# Patient Record
Sex: Female | Born: 1945
Health system: Southern US, Community
[De-identification: ages and names within clinical notes are randomized; demographics above are authoritative.]

## PROBLEM LIST (undated history)

## (undated) DIAGNOSIS — M1990 Unspecified osteoarthritis, unspecified site: Secondary | ICD-10-CM

## (undated) DIAGNOSIS — M419 Scoliosis, unspecified: Secondary | ICD-10-CM

## (undated) DIAGNOSIS — I10 Essential (primary) hypertension: Secondary | ICD-10-CM

## (undated) DIAGNOSIS — M5136 Other intervertebral disc degeneration, lumbar region: Secondary | ICD-10-CM

## (undated) DIAGNOSIS — M51369 Other intervertebral disc degeneration, lumbar region without mention of lumbar back pain or lower extremity pain: Secondary | ICD-10-CM

## (undated) HISTORY — DX: Essential (primary) hypertension: I10

## (undated) HISTORY — DX: Unspecified osteoarthritis, unspecified site: M19.90

## (undated) HISTORY — DX: Scoliosis, unspecified: M41.9

## (undated) HISTORY — DX: Other intervertebral disc degeneration, lumbar region: M51.36

## (undated) HISTORY — PX: OTHER SURGICAL HISTORY: SHX169

## (undated) HISTORY — PX: TUBAL LIGATION: SHX77

## (undated) HISTORY — DX: Other intervertebral disc degeneration, lumbar region without mention of lumbar back pain or lower extremity pain: M51.369

---

## 2016-02-15 ENCOUNTER — Ambulatory Visit (INDEPENDENT_AMBULATORY_CARE_PROVIDER_SITE_OTHER): Payer: Medicare Other | Admitting: Osteopathic Medicine

## 2016-02-15 VITALS — BP 170/90 | HR 108 | Temp 98.0°F | Resp 17 | Ht 64.0 in | Wt 203.0 lb

## 2016-02-15 DIAGNOSIS — B9789 Other viral agents as the cause of diseases classified elsewhere: Principal | ICD-10-CM

## 2016-02-15 DIAGNOSIS — I10 Essential (primary) hypertension: Secondary | ICD-10-CM | POA: Diagnosis not present

## 2016-02-15 DIAGNOSIS — J069 Acute upper respiratory infection, unspecified: Secondary | ICD-10-CM | POA: Diagnosis not present

## 2016-02-15 MED ORDER — BENZONATATE 200 MG PO CAPS: 200.0000 mg | ORAL_CAPSULE | Freq: Three times a day (TID) | ORAL | Status: DC | PRN

## 2016-02-15 MED ORDER — HYDROCODONE-HOMATROPINE 5-1.5 MG/5ML PO SYRP: 5.0000 mL | ORAL_SOLUTION | Freq: Three times a day (TID) | ORAL | Status: DC | PRN

## 2016-02-15 MED ORDER — IPRATROPIUM BROMIDE 0.03 % NA SOLN: 2.0000 | Freq: Three times a day (TID) | NASAL | Status: DC

## 2016-02-15 MED ORDER — HYDROCHLOROTHIAZIDE 25 MG PO TABS: 12.5000 mg | ORAL_TABLET | Freq: Every day | ORAL | Status: DC

## 2016-02-15 NOTE — Progress Notes (Signed)
HPI: Beth Bryant is a 70 y.o. female who presents to [facility] today for chief complaint of:  Chief Complaint  Patient presents with  . Nasal Congestion  . Cough  . Laryngitis  . Shortness of Breath    ILLNESS . Location: head and R ear . Quality: burning, scratchy, coughing . Duration: 4 days or so . Modifying factors: has tried Tylenol and OTC DayQuil   HYPERTENSION - Patient reports that her blood pressure has been high in the past, she is very resistant to starting medications for this, she says that it is due to being overweight. last blood work 6 - 7 years ago at a different clinic for routine screening for cholesterol diabetes and others.. Patient denies chest pain/pressure, palpitations, shortness of breath.   Past medical, social and family history reviewed: No past medical history on file. No past surgical history on file. Social History  Substance Use Topics  . Smoking status: Never Smoker   . Smokeless tobacco: Not on file  . Alcohol Use: Not on file   No family history on file.  Current Outpatient Prescriptions  Medication Sig Dispense Refill  . Multiple Vitamins-Minerals (CENTRUM ADULTS PO) Take by mouth.    . Omega-3 Fatty Acids (FISH OIL) 1000 MG CAPS Take by mouth.    . polycarbophil (FIBERCON) 625 MG tablet Take 625 mg by mouth daily.     No current facility-administered medications for this visit.   No Known Allergies    Review of Systems: CONSTITUTIONAL:  No  fever, no chills, HEAD/EYES/EARS/NOSE/THROAT: (+) headache, no vision change, no hearing change, (+) sore throat, (+) sinus pressure CARDIAC: No  chest pain, RESPIRATORY: (+) cough, No  shortness of breath/wheeze GASTROINTESTINAL: No nausea, No  vomiting, No  abdominal pain, SKIN: No  rash/wounds/concerning lesions   Exam:  BP 170/90 mmHg  Pulse 108  Temp(Src) 98 F (36.7 C) (Oral)  Resp 17  Ht  (1.626 m)  Wt 203 lb (92.08 kg)  BMI 34.83 kg/m2   SpO2 96% Constitutional: VS see above. General Appearance: alert, well-developed, well-nourished, NAD Eyes: Normal lids and conjunctive, non-icteric sclera,  Ears, Nose, Mouth, Throat: MMM, Normal external inspection ears/nares/mouth/lips/gums, TM normal bilaterally. Pharynx (+) erythema, no exudate. Coughing.  Neck: No masses, trachea midline. No thyroid enlargement/tenderness/mass appreciated. No lymphadenopathy Respiratory: Normal respiratory effort. no wheeze, no rhonchi, no rales Cardiovascular: S1/S2 normal, no murmur, no rub/gallop auscultated. RRR.  Musculoskeletal: Gait normal. No clubbing/cyanosis of digits.  Skin: warm, dry, intact. No rash/ulcer Psychiatric: Normal judgment/insight. Normal mood and affect.     ASSESSMENT/PLAN:   Most likely viral URI given duration and severity of symptoms, treat symptomatically as below . ER/RTC precautions reviewed   Patient initially declined blood pressure medications however as I was working on her discharge noted that she was okay to start something low-dose. I advised we should get CBC and CMP today for medication safety purposes, she will still need follow-up within 2 weeks for recheck of blood pressure and further discussion.  Viral URI with cough - Medications as below, avoid decongestants and NSAIDs.  - Plan: ipratropium (ATROVENT) 0.03 % nasal spray, benzonatate (TESSALON) 200 MG capsule, HYDROcodone-homatropine (HYCODAN) 5-1.5 MG/5ML syrup  Essential hypertension - Patient declines to initiate medication today - Plan: hydrochlorothiazide (HYDRODIURIL) 25 MG tablet, CBC with Differential/Platelet, COMPLETE METABOLIC PANEL WITH GFR      Visit summary printed and instructions reviewed with the patient. All questions answered.  Return in about 2  weeks (around 02/29/2016), or sooner if needed, for BLOOD PRESSURE FOLLOW UP.

## 2016-02-15 NOTE — Addendum Note (Signed)
Addended by: Marcellus ScottADKINS, Hassie Mandt L on: 02/15/2016 05:04 PM   Modules accepted: Orders

## 2016-02-15 NOTE — Patient Instructions (Addendum)
You have been given prescriptions for nasal spray to help with drainage and sore throat/cough, pills for cough to use any time, syrup for cough to use at nighttime/evening. You can also use Tylenol, this will not affect blood pressure. I would avoid NSAID medications such as ibuprofen, Aleve, Motrin. I would also avoid decongestant medications which are sometimes found in DayQuil. You can also use oral antihistamines over-the-counter such as Benadryl, Zyrtec, Claritin, Allegra or the generic equivalents, ask your pharmacist if there are any questions.  Physical exam today was not concerning for pneumonia/bronchitis, lung sounds were clear. If you find that you are breathing worse, particularly feeling short winded, more productive cough, fever or chest pain, please seek medical attention ASAP.  If you're not feeling better 7-10 days after your symptoms have begun, please let us know.   I highly recommend that he follow-up once you're feeling well for recheck of blood pressure, as well as routine screening labs for other problems such as cholesterol or diabetes issues. High blood pressure over time can put people at risk for heart attack, stroke, and other problems and needs to be taken seriously.       IF you received an x-ray today, you will receive an invoice from [facility]. Please contact [facility] at [phone] with questions or concerns regarding your invoice.   IF you received labwork today, you will receive an invoice from [facility]. Please contact [facility] at [phone] with questions or concerns regarding your invoice.   Our billing staff will not be able to assist you with questions regarding bills from these companies.  You will be contacted with the lab results as soon as they are available. The fastest way to get your results is to activate your My Chart account. Instructions are located on the last page of this paperwork. If you  have not heard from us regarding the results in 2 weeks, please contact this office.

## 2016-02-15 NOTE — Progress Notes (Signed)
Patient walked out of clinic prior to receiving blood draw

## 2016-04-18 ENCOUNTER — Ambulatory Visit (INDEPENDENT_AMBULATORY_CARE_PROVIDER_SITE_OTHER): Payer: Medicare Other

## 2016-04-18 ENCOUNTER — Ambulatory Visit (INDEPENDENT_AMBULATORY_CARE_PROVIDER_SITE_OTHER): Payer: Medicare Other | Admitting: Family Medicine

## 2016-04-18 ENCOUNTER — Encounter: Payer: Self-pay | Admitting: Family Medicine

## 2016-04-18 VITALS — BP 189/119 | HR 99 | Temp 97.1°F | Ht 64.0 in | Wt 199.0 lb

## 2016-04-18 DIAGNOSIS — R635 Abnormal weight gain: Secondary | ICD-10-CM

## 2016-04-18 DIAGNOSIS — I10 Essential (primary) hypertension: Secondary | ICD-10-CM | POA: Diagnosis not present

## 2016-04-18 DIAGNOSIS — Z7189 Other specified counseling: Secondary | ICD-10-CM

## 2016-04-18 DIAGNOSIS — M25512 Pain in left shoulder: Secondary | ICD-10-CM

## 2016-04-18 DIAGNOSIS — Z7689 Persons encountering health services in other specified circumstances: Secondary | ICD-10-CM

## 2016-04-18 DIAGNOSIS — M25519 Pain in unspecified shoulder: Secondary | ICD-10-CM | POA: Insufficient documentation

## 2016-04-18 LAB — URINALYSIS, COMPLETE
BILIRUBIN UA: NEGATIVE
Glucose, UA: NEGATIVE
KETONES UA: NEGATIVE
NITRITE UA: NEGATIVE
Protein, UA: NEGATIVE
RBC UA: NEGATIVE
SPEC GRAV UA: 1.02 (ref 1.005–1.030)
Urobilinogen, Ur: 0.2 mg/dL (ref 0.2–1.0)
pH, UA: 6.5 (ref 5.0–7.5)

## 2016-04-18 LAB — MICROSCOPIC EXAMINATION

## 2016-04-18 MED ORDER — LOSARTAN POTASSIUM-HCTZ 50-12.5 MG PO TABS: 1.0000 | ORAL_TABLET | Freq: Every day | ORAL | Status: DC

## 2016-04-18 NOTE — Patient Instructions (Signed)
Continue current medications. Continue good therapeutic lifestyle changes which include good diet and exercise. Fall precautions discussed with patient. If an FOBT was given today- please return it to our front desk. If you are over 68 years old - you may need Prevnar 13 or the adult Pneumonia vaccine.   After your visit with us today you will receive a survey in the mail or online from [employer] regarding your care with us. Please take a moment to fill this out. Your feedback is very important to us as you can help us better understand your patient needs as well as improve your experience and satisfaction. WE CARE ABOUT YOU!!!    

## 2016-04-18 NOTE — Progress Notes (Signed)
Subjective:    Patient ID: Beth Bryant, female    DOB: August 01, 1946, 70 y.o.   MRN: [id number]  HPI  Pt here for follow up and management of chronic medical problems. She is new with [place] today and she is accompanied today by her husband.  Patient had been seen here previously but is been some years. Her concerns today are her weight which continues to rise and pain in her left shoulder and arm. Regarding the latter, she has seen a chiropractor who has been working on her neck. There is been no injury to the shoulder.  She had been seen recently at [facility] urgent care and given hydrochlorothiazide for blood pressure but failed to get the prescription. She is against taking medicine because she had a son who died from 2 but should anticonvulsant she occasionally takes Tylenol Extra Strength for her arthritis but even is opposed to that.     Patient Active Problem List   Diagnosis Date Noted  . Viral URI with cough 02/15/2016  . Essential hypertension 02/15/2016   Outpatient Encounter Prescriptions as of 04/18/2016  Medication Sig  . [DISCONTINUED] benzonatate (TESSALON) 200 MG capsule Take 1 capsule (200 mg total) by mouth 3 (three) times daily as needed for cough.  . [DISCONTINUED] hydrochlorothiazide (HYDRODIURIL) 25 MG tablet Take 0.5 tablets (12.5 mg total) by mouth daily.  . [DISCONTINUED] HYDROcodone-homatropine (HYCODAN) 5-1.5 MG/5ML syrup Take 5 mLs by mouth every 8 (eight) hours as needed for cough (Caution, can cause sedation, use in evening/nighttime).  . [DISCONTINUED] ipratropium (ATROVENT) 0.03 % nasal spray Place 2 sprays into both nostrils 3 (three) times daily.  . [DISCONTINUED] Multiple Vitamins-Minerals (CENTRUM ADULTS PO) Take by mouth.  . [DISCONTINUED] Omega-3 Fatty Acids (FISH OIL) 1000 MG CAPS Take by mouth.  . [DISCONTINUED] polycarbophil (FIBERCON) 625 MG tablet Take 625 mg by mouth daily.   No facility-administered encounter medications on file as of 04/18/2016.       Review of Systems  Constitutional: Positive for unexpected weight change.  HENT: Negative.   Eyes: Negative.   Respiratory: Negative.   Cardiovascular: Negative.        Elevated BP  Gastrointestinal: Negative.   Endocrine: Negative.   Genitourinary: Negative.   Musculoskeletal: Positive for arthralgias (left shoudler and arm pain).  Skin: Negative.   Allergic/Immunologic: Negative.   Neurological: Negative.   Hematological: Negative.   Psychiatric/Behavioral: Negative.        Objective:   Physical Exam  Constitutional: She appears well-developed and well-nourished.  Cardiovascular: Normal rate and regular rhythm.   Pulmonary/Chest: Effort normal.  Musculoskeletal:  Left shoulder has good range of motion. There is tenderness both anteriorly over the bicipital tendon and laterally over the deltoid tendon and subacromial bursa. X-ray shows some calcification in either the lateral tendon or bursa as well as some spurring and arthritic changes around the joint    BP 189/119 mmHg  Pulse 99  Temp(Src) 97.1 F (36.2 C) (Oral)  Ht _0  (1.626 m)  Wt 199 lb (90.266 kg)  BMI 34.14 kg/m2       Assessment & Plan:  1. Establishing care with new doctor, encounter for Patient is interested in lab work but against taking anything so hopefully we will not find anything that needs treatment - CMP14+EGFR - Lipid panel - Urinalysis, Complete  2. Left shoulder pain Suspect this pain is multifactorial. I think she has some tendinitis or bursitis. She also has disc in her neck which is likely a contributing  factor. She declined injection - DG Shoulder Left; Future  3. Increased body weight Probably related to age and relative inactivity but will check thyroid - TSH - CMP14+EGFR - Lipid panel    5. Essential hypertension Patient is reluctant to take medicine but I definitely feel it's indicated with pressure 189/119. We will begin losartan and hydrochlorothiazide, 50/12.5.  Plan to recheck in one month  Wardell Honour MD

## 2016-04-19 LAB — CMP14+EGFR
ALBUMIN: 4.5 g/dL (ref 3.6–4.8)
ALK PHOS: 76 IU/L (ref 39–117)
ALT: 76 IU/L — ABNORMAL HIGH (ref 0–32)
AST: 76 IU/L — AB (ref 0–40)
Albumin/Globulin Ratio: 1.2 (ref 1.2–2.2)
BILIRUBIN TOTAL: 0.8 mg/dL (ref 0.0–1.2)
BUN / CREAT RATIO: 15 (ref 12–28)
BUN: 10 mg/dL (ref 8–27)
CHLORIDE: 99 mmol/L (ref 96–106)
CO2: 24 mmol/L (ref 18–29)
Calcium: 9.6 mg/dL (ref 8.7–10.3)
Creatinine, Ser: 0.65 mg/dL (ref 0.57–1.00)
GFR calc Af Amer: 105 mL/min/{1.73_m2} (ref >59)
GFR calc non Af Amer: 92 mL/min/{1.73_m2} (ref >59)
GLOBULIN, TOTAL: 3.7 g/dL (ref 1.5–4.5)
GLUCOSE: 92 mg/dL (ref 65–99)
Potassium: 4.6 mmol/L (ref 3.5–5.2)
SODIUM: 140 mmol/L (ref 134–144)
Total Protein: 8.2 g/dL (ref 6.0–8.5)

## 2016-04-19 LAB — TSH: TSH: 1.45 u[IU]/mL (ref 0.450–4.500)

## 2016-04-19 LAB — LIPID PANEL
CHOLESTEROL TOTAL: 219 mg/dL — AB (ref 100–199)
Chol/HDL Ratio: 2.9 ratio units (ref 0.0–4.4)
HDL: 75 mg/dL (ref >39)
LDL Calculated: 119 mg/dL — ABNORMAL HIGH (ref 0–99)
TRIGLYCERIDES: 123 mg/dL (ref 0–149)
VLDL Cholesterol Cal: 25 mg/dL (ref 5–40)

## 2016-05-21 ENCOUNTER — Ambulatory Visit (INDEPENDENT_AMBULATORY_CARE_PROVIDER_SITE_OTHER): Payer: Medicare Other | Admitting: Family Medicine

## 2016-05-21 ENCOUNTER — Encounter: Payer: Self-pay | Admitting: Family Medicine

## 2016-05-21 VITALS — BP 141/92 | HR 89 | Temp 97.0°F | Ht 64.0 in | Wt 201.4 lb

## 2016-05-21 DIAGNOSIS — I10 Essential (primary) hypertension: Secondary | ICD-10-CM | POA: Diagnosis not present

## 2016-05-21 MED ORDER — GABAPENTIN 300 MG PO CAPS: ORAL_CAPSULE | ORAL | Status: DC

## 2016-05-21 NOTE — Progress Notes (Signed)
   Subjective:    Patient ID: Mariella SaaCharlotte Kil, female    DOB: 1946/07/19, 70 y.o.   MRN: [id number]  HPI Patient is here today for a 1 month follow up on her hypertension. Patient is tolerating losartan/hctz 50/25.  Patient seems pleased that her blood pressure is normalized. She feels better in general without the headaches. Pressures at home have again been better than we recorded here this morning. I had some doubts that she would take the medicine but today she is asking for something for back and neck. Apparently she has some disc disease per the chiropractor. She has some paresthesias in both arms and legs as well as pain in neck and back. She asked about MRI but I told her we probably needed some conservative therapy before MRI will be paid for.   Review of Systems  Constitutional: Negative.   HENT: Negative.   Eyes: Negative.   Respiratory: Negative.   Cardiovascular: Negative.   Gastrointestinal: Negative.   Endocrine: Negative.   Genitourinary: Negative.   Musculoskeletal: Negative.   Skin: Negative.   Allergic/Immunologic: Negative.   Neurological: Negative.   Hematological: Negative.   Psychiatric/Behavioral: Negative.     [facility] 05/21/2016 04/18/2016 02/15/2016  Decreased Interest 0 0 0  Down, Depressed, Hopeless 1 1 0  PHQ - 2 Score 1 1 0       Patient Active Problem List   Diagnosis Date Noted  . Pain in joint, shoulder region 04/18/2016  . Viral URI with cough 02/15/2016  . Essential hypertension 02/15/2016   Outpatient Encounter Prescriptions as of 05/21/2016  Medication Sig  . losartan-hydrochlorothiazide (HYZAAR) 50-12.5 MG tablet Take 1 tablet by mouth daily.   No facility-administered encounter medications on file as of 05/21/2016.       Objective:   Physical Exam  Constitutional: She appears well-developed and well-nourished.  Cardiovascular: Normal rate and regular rhythm.   Pulmonary/Chest: Effort normal and breath sounds  normal.  Musculoskeletal:  Reflexes are symmetric in the lower and upper extremities and strength is appropriate 5+ arms and legs   BP 141/92 mmHg  Pulse 89  Temp(Src) 97 F (36.1 C) (Oral)  Ht 5\' 4"  (1.626 m)  Wt 201 lb 6.4 oz (91.354 kg)  BMI 34.55 kg/m2        Assessment & Plan:  1. Essential hypertension Spots to losartan hydrochlorothiazide. Continue same.  Frederica KusterStephen M Miller MD

## 2016-05-22 ENCOUNTER — Telehealth: Payer: Self-pay | Admitting: Family Medicine

## 2016-05-23 NOTE — Telephone Encounter (Signed)
Spoke with pt and advised Gabapentin can be used commonly for multiple different disorders. Pt voiced understanding and will try taking it tonight.

## 2016-06-09 ENCOUNTER — Encounter: Payer: Self-pay | Admitting: Family Medicine

## 2016-10-05 ENCOUNTER — Other Ambulatory Visit: Payer: Self-pay | Admitting: Family Medicine

## 2016-10-06 ENCOUNTER — Other Ambulatory Visit: Payer: Self-pay | Admitting: Family Medicine

## 2016-10-06 MED ORDER — LOSARTAN POTASSIUM-HCTZ 50-12.5 MG PO TABS: 1.0000 | ORAL_TABLET | Freq: Every day | ORAL | 0 refills | Status: DC

## 2016-10-06 NOTE — Telephone Encounter (Signed)
appt made for 12/8 & refill for #30 sent to [facility]

## 2016-10-16 NOTE — Progress Notes (Signed)
   Subjective:    Patient ID: Beth Bryant, female    DOB: 1946-04-23, 70 y.o.   MRN: [id number]  HPI 70 year old female here to follow-up hypertension she generally feels better once her blood pressure has been controlled. The morning headaches have disappeared. She is starting to move about more even walking.  Patient Active Problem List   Diagnosis Date Noted  . Pain in joint, shoulder region 04/18/2016  . Viral URI with cough 02/15/2016  . Essential hypertension 02/15/2016   Outpatient Encounter Prescriptions as of 10/17/2016  Medication Sig  . gabapentin (NEURONTIN) 300 MG capsule Take 1 tablet at bedtime for 3 days and add 1 tablet in a.m.  Marland Kitchen. losartan-hydrochlorothiazide (HYZAAR) 50-12.5 MG tablet Take 1 tablet by mouth daily.   No facility-administered encounter medications on file as of 10/17/2016.       Review of Systems  Constitutional: Negative.   Respiratory: Negative.   Cardiovascular: Negative.   Gastrointestinal: Negative.   Genitourinary: Negative.   Neurological: Negative.   Psychiatric/Behavioral: Negative.        Objective:   Physical Exam  Constitutional: She is oriented to person, place, and time. She appears well-developed and well-nourished.  HENT:  Head: Normocephalic.  Eyes: Pupils are equal, round, and reactive to light.  Cardiovascular: Normal rate and regular rhythm.   Pulmonary/Chest: Effort normal and breath sounds normal.  Abdominal: Soft.  Neurological: She is alert and oriented to person, place, and time.  Psychiatric: She has a normal mood and affect. Her behavior is normal.   BP 137/84 (BP Location: Left Arm, Patient Position: Sitting, Cuff Size: Normal)   Pulse 83   Temp 97 F (36.1 C) (Oral)   Ht 5\' 4"  (1.626 m)   Wt 206 lb (93.4 kg)   BMI 35.36 kg/m         Assessment & Plan:  1. Essential hypertension Blood pressure is well controlled. We talked about taking medicine at night. Even though it has diuretic it does not  make her urinate more frequently. I think we will get better morning and early day coverage taking medicine at night. Return in 6 months  Frederica KusterStephen M Miller MD

## 2016-10-17 ENCOUNTER — Ambulatory Visit (INDEPENDENT_AMBULATORY_CARE_PROVIDER_SITE_OTHER): Payer: Medicare Other | Admitting: Family Medicine

## 2016-10-17 ENCOUNTER — Encounter: Payer: Self-pay | Admitting: Family Medicine

## 2016-10-17 VITALS — BP 137/84 | HR 83 | Temp 97.0°F | Ht 64.0 in | Wt 206.0 lb

## 2016-10-17 DIAGNOSIS — I10 Essential (primary) hypertension: Secondary | ICD-10-CM

## 2016-10-17 MED ORDER — LOSARTAN POTASSIUM-HCTZ 50-12.5 MG PO TABS: 1.0000 | ORAL_TABLET | Freq: Every day | ORAL | 1 refills | Status: DC

## 2016-11-24 ENCOUNTER — Other Ambulatory Visit: Payer: Self-pay | Admitting: Family Medicine

## 2016-11-25 ENCOUNTER — Other Ambulatory Visit: Payer: Self-pay | Admitting: Family Medicine

## 2016-11-25 MED ORDER — GABAPENTIN 300 MG PO CAPS: ORAL_CAPSULE | ORAL | 3 refills | Status: DC

## 2016-11-25 NOTE — Telephone Encounter (Signed)
Med lled

## 2017-01-26 ENCOUNTER — Encounter: Payer: Self-pay | Admitting: Family Medicine

## 2017-01-26 ENCOUNTER — Ambulatory Visit (INDEPENDENT_AMBULATORY_CARE_PROVIDER_SITE_OTHER): Payer: Medicare Other | Admitting: Family Medicine

## 2017-01-26 VITALS — BP 134/81 | HR 92 | Temp 97.1°F | Ht 64.0 in | Wt 204.0 lb

## 2017-01-26 DIAGNOSIS — I10 Essential (primary) hypertension: Secondary | ICD-10-CM

## 2017-01-26 MED ORDER — LOSARTAN POTASSIUM-HCTZ 50-12.5 MG PO TABS: 1.0000 | ORAL_TABLET | Freq: Every day | ORAL | 1 refills | Status: DC

## 2017-01-26 NOTE — Progress Notes (Signed)
   Subjective:    Patient ID: Beth Bryant, female    DOB: 06-22-1946, 71 y.o.   MRN: [id number]  HPI Patient here today for follow up on HTN.  Patient monitors pressure at home but she brought in her cuff and when compared to cuff here pressures were much higher with hers. I think it probably is not accurate. She takes losartan and hydrochlorothiazide and tolerates this well. She had also previously been on gabapentin for some joint symptoms but still did not feel it was effective and discontinued.    Patient Active Problem List   Diagnosis Date Noted  . Pain in joint, shoulder region 04/18/2016  . Viral URI with cough 02/15/2016  . Essential hypertension 02/15/2016   Outpatient Encounter Prescriptions as of 01/26/2017  Medication Sig  . losartan-hydrochlorothiazide (HYZAAR) 50-12.5 MG tablet Take 1 tablet by mouth daily.  Marland Kitchen. gabapentin (NEURONTIN) 300 MG capsule Take 1 tablet at bedtime for 3 days and add 1 tablet in a.m. (Patient not taking: Reported on 01/26/2017)   No facility-administered encounter medications on file as of 01/26/2017.      Review of Systems  Constitutional: Negative.   HENT: Negative.   Eyes: Negative.   Respiratory: Negative.   Cardiovascular: Negative.   Gastrointestinal: Negative.   Endocrine: Negative.   Genitourinary: Negative.   Musculoskeletal: Negative.   Skin: Negative.   Allergic/Immunologic: Negative.   Neurological: Negative.   Hematological: Negative.   Psychiatric/Behavioral: Negative.        Objective:   Physical Exam  Constitutional: She is oriented to person, place, and time. She appears well-developed and well-nourished.  Cardiovascular: Normal rate, regular rhythm and normal heart sounds.   Pulmonary/Chest: Effort normal and breath sounds normal.  Musculoskeletal: She exhibits no edema.  Neurological: She is alert and oriented to person, place, and time.  Psychiatric: She has a normal mood and affect. Her behavior is normal.     BP 134/81 (BP Location: Left Arm)   Pulse 92   Temp 97.1 F (36.2 C) (Oral)   Ht 5\' 4"  (1.626 m)   Wt 204 lb (92.5 kg)   BMI 35.02 kg/m         Assessment & Plan:  1. Essential hypertension Blood pressure is well controlled on losartan and hydrochlorothiazide  Frederica KusterStephen M Miller MD

## 2017-06-01 ENCOUNTER — Ambulatory Visit (INDEPENDENT_AMBULATORY_CARE_PROVIDER_SITE_OTHER): Payer: Medicare Other | Admitting: Family Medicine

## 2017-06-01 ENCOUNTER — Encounter: Payer: Self-pay | Admitting: Family Medicine

## 2017-06-01 ENCOUNTER — Ambulatory Visit (INDEPENDENT_AMBULATORY_CARE_PROVIDER_SITE_OTHER): Payer: Medicare Other

## 2017-06-01 VITALS — BP 131/89 | HR 120 | Temp 97.0°F | Ht 64.0 in | Wt 191.8 lb

## 2017-06-01 DIAGNOSIS — R0982 Postnasal drip: Secondary | ICD-10-CM

## 2017-06-01 DIAGNOSIS — R059 Cough, unspecified: Secondary | ICD-10-CM

## 2017-06-01 DIAGNOSIS — R05 Cough: Secondary | ICD-10-CM

## 2017-06-01 NOTE — Patient Instructions (Signed)
Great to meet you1  Start using the neti pot daily - start taking plain zyrtec 1 pill once daily, give it a week or so and I think you will see some improvements

## 2017-06-01 NOTE — Progress Notes (Signed)
   HPI  Patient presents today here with congestion and cough.  Patient's lines over the last 6-8 months she's had persistent cough with worsening congestion.  Patient states that she has recurrent throat clearing early morning, she wakes up at night with productive cough, she does have some mild intermittent dyspnea during these episodes. She denies any fever, chills, sweats.  She does not like a dry nose and so has not tried many medications, also because of concerns about mixing with hypertensive medications.   PMH: Smoking status noted ROS: Per HPI  Objective: BP 131/89   Pulse (!) 120   Temp (!) 97 F (36.1 C) (Oral)   Ht 5\' 4"  (1.626 m)   Wt 191 lb 12.8 oz (87 kg)   SpO2 94%   BMI 32.92 kg/m  Gen: NAD, alert, cooperative with exam HEENT: NCAT, nares with swollen turbinates bilaterally and erythematous mucosa, oropharynx clear, TMs normal bilaterally CV: RRR, good S1/S2, no murmur Resp: Soft crackles in bilateral bases, otherwise clear Abd: SNTND, BS present, no guarding or organomegaly Ext: No edema, warm Neuro: Alert and oriented, No gross deficits  Assessment and plan:  # Postnasal drip, cough Patient with possibly multifactorial cough, certainly she has some postnasal drip and shooting. Start using nasla saline rinses daily,  Start antihistamine DG CXR with soft crackles- no signs of tryue volume overload.     Orders Placed This Encounter  Procedures  . DG Chest 2 View    Standing Status:   Future    Standing Expiration Date:   08/02/2018    Order Specific Question:   Reason for Exam (SYMPTOM  OR DIAGNOSIS REQUIRED)    Answer:   cough, r/o CAP or effusion    Order Specific Question:   Preferred imaging location?    Answer:   Internal    Order Specific Question:   Radiology Contrast Protocol - do NOT remove file path    Answer:   [email or web address]    Murtis SinkSam Jaidyn Kuhl, MD [facility] 06/01/2017, 11:55 AM

## 2017-07-29 ENCOUNTER — Ambulatory Visit (INDEPENDENT_AMBULATORY_CARE_PROVIDER_SITE_OTHER): Payer: Medicare Other | Admitting: Family Medicine

## 2017-07-29 ENCOUNTER — Encounter: Payer: Self-pay | Admitting: Family Medicine

## 2017-07-29 VITALS — BP 115/76 | HR 106 | Temp 97.9°F | Ht 64.0 in | Wt 186.5 lb

## 2017-07-29 DIAGNOSIS — Z6830 Body mass index (BMI) 30.0-30.9, adult: Secondary | ICD-10-CM | POA: Diagnosis not present

## 2017-07-29 DIAGNOSIS — R0981 Nasal congestion: Secondary | ICD-10-CM | POA: Diagnosis not present

## 2017-07-29 DIAGNOSIS — Z780 Asymptomatic menopausal state: Secondary | ICD-10-CM

## 2017-07-29 DIAGNOSIS — Z1159 Encounter for screening for other viral diseases: Secondary | ICD-10-CM | POA: Diagnosis not present

## 2017-07-29 DIAGNOSIS — E669 Obesity, unspecified: Secondary | ICD-10-CM | POA: Diagnosis not present

## 2017-07-29 DIAGNOSIS — I10 Essential (primary) hypertension: Secondary | ICD-10-CM | POA: Diagnosis not present

## 2017-07-29 MED ORDER — LOSARTAN POTASSIUM-HCTZ 50-12.5 MG PO TABS: 1.0000 | ORAL_TABLET | Freq: Every day | ORAL | 3 refills | Status: DC

## 2017-07-29 MED ORDER — FLUTICASONE PROPIONATE 50 MCG/ACT NA SUSP: 1.0000 | Freq: Two times a day (BID) | NASAL | 6 refills | Status: DC | PRN

## 2017-07-29 NOTE — Progress Notes (Signed)
BP (!) 145/96   Pulse (!) 106   Temp 97.9 F (36.6 C) (Oral)   Ht '5\' 4"'$  (1.626 m)   Wt 186 lb 8 oz (84.6 kg)   BMI 32.01 kg/m    Subjective:    Patient ID: Beth Bryant, female    DOB: Jul 18, 1946, 71 y.o.   MRN: [id number]  HPI: Beth Bryant is a 71 y.o. female presenting on 07/29/2017 for Hypertension (6 mo) and Cough (sinus drainage; using Mucinex Severe Cold)   HPI Hypertension Patient is currently on Lisinopril-hydrochlorothiazide and is 145/96 initially and then improved on recheck. She says that her blood pressures run pretty well at home*. Patient denies any lightheadedness or dizziness. Patient denies headaches, blurred vision, chest pains, shortness of breath, or weakness. Denies any side effects from medication and is content with current medication.   Patient has been having sinus congestion that's been going on off and on over the past couple months. She says it will get worse and then better than worse in the better. She's been using Mucinex for it which does help significantly but cannot eradicate it.  Relevant past medical, surgical, family and social history reviewed and updated as indicated. Interim medical history since our last visit reviewed. Allergies and medications reviewed and updated.  Review of Systems  Constitutional: Negative for chills and fever.  HENT: Positive for congestion, postnasal drip, rhinorrhea, sinus pressure, sneezing and sore throat. Negative for ear discharge and ear pain.   Eyes: Negative for pain, redness and visual disturbance.  Respiratory: Positive for cough. Negative for chest tightness and shortness of breath.   Cardiovascular: Negative for chest pain and leg swelling.  Genitourinary: Negative for difficulty urinating and dysuria.  Musculoskeletal: Negative for back pain and gait problem.  Skin: Negative for rash.  Neurological: Negative for dizziness, light-headedness and headaches.  Psychiatric/Behavioral: Negative  for agitation and behavioral problems.  All other systems reviewed and are negative.   Per HPI unless specifically indicated above     Objective:    BP (!) 145/96   Pulse (!) 106   Temp 97.9 F (36.6 C) (Oral)   Ht '5\' 4"'$  (1.626 m)   Wt 186 lb 8 oz (84.6 kg)   BMI 32.01 kg/m   Wt Readings from Last 3 Encounters:  07/29/17 186 lb 8 oz (84.6 kg)  06/01/17 191 lb 12.8 oz (87 kg)  01/26/17 204 lb (92.5 kg)    Physical Exam  Constitutional: She is oriented to person, place, and time. She appears well-developed and well-nourished. No distress.  HENT:  Right Ear: Tympanic membrane, external ear and ear canal normal.  Left Ear: Tympanic membrane, external ear and ear canal normal.  Nose: Mucosal edema and rhinorrhea present. No epistaxis. Right sinus exhibits no maxillary sinus tenderness and no frontal sinus tenderness. Left sinus exhibits no maxillary sinus tenderness and no frontal sinus tenderness.  Mouth/Throat: Uvula is midline and mucous membranes are normal. Posterior oropharyngeal edema present. No oropharyngeal exudate, posterior oropharyngeal erythema or tonsillar abscesses.  Eyes: Conjunctivae and EOM are normal.  Cardiovascular: Normal rate, regular rhythm, normal heart sounds and intact distal pulses.   No murmur heard. Pulmonary/Chest: Effort normal and breath sounds normal. No respiratory distress. She has no wheezes.  Musculoskeletal: Normal range of motion. She exhibits no edema or tenderness.  Neurological: She is alert and oriented to person, place, and time. Coordination normal.  Skin: Skin is warm and dry. No rash noted. She is not diaphoretic.  Psychiatric: She has  a normal mood and affect. Her behavior is normal.  Vitals reviewed.       Assessment & Plan:   Problem List Items Addressed This Visit      Cardiovascular and Mediastinum   Essential hypertension - Primary   Relevant Medications   losartan-hydrochlorothiazide (HYZAAR) 50-12.5 MG tablet    Other Relevant Orders   CMP14+EGFR    Other Visit Diagnoses    Class 1 obesity without serious comorbidity with body mass index (BMI) of 30.0 to 30.9 in adult, unspecified obesity type       Relevant Orders   Lipid panel   Need for hepatitis C screening test       Relevant Orders   Hepatitis C antibody   Postmenopausal       Relevant Orders   DG Bone Density   Sinus congestion       Relevant Medications   fluticasone (FLONASE) 50 MCG/ACT nasal spray       Follow up plan: Return in about 6 months (around 01/26/2018), or if symptoms worsen or fail to improve, for Hypertension follow-up and labs.  Counseling provided for all of the vaccine components No orders of the defined types were placed in this encounter.   Caryl Pina, MD [facility] 07/29/2017, 1:28 PM

## 2017-07-30 LAB — CMP14+EGFR
A/G RATIO: 1.2 (ref 1.2–2.2)
ALK PHOS: 73 IU/L (ref 39–117)
ALT: 49 IU/L — ABNORMAL HIGH (ref 0–32)
AST: 57 IU/L — AB (ref 0–40)
Albumin: 4.6 g/dL (ref 3.6–4.8)
BILIRUBIN TOTAL: 0.5 mg/dL (ref 0.0–1.2)
BUN/Creatinine Ratio: 12 (ref 12–28)
BUN: 9 mg/dL (ref 8–27)
CO2: 28 mmol/L (ref 20–29)
Calcium: 10.2 mg/dL (ref 8.7–10.3)
Chloride: 95 mmol/L — ABNORMAL LOW (ref 96–106)
Creatinine, Ser: 0.76 mg/dL (ref 0.57–1.00)
GFR calc Af Amer: 93 mL/min/{1.73_m2} (ref >59)
GFR, EST NON AFRICAN AMERICAN: 80 mL/min/{1.73_m2} (ref >59)
GLOBULIN, TOTAL: 3.8 g/dL (ref 1.5–4.5)
Glucose: 82 mg/dL (ref 65–99)
POTASSIUM: 4.6 mmol/L (ref 3.5–5.2)
SODIUM: 136 mmol/L (ref 134–144)
Total Protein: 8.4 g/dL (ref 6.0–8.5)

## 2017-07-30 LAB — LIPID PANEL
CHOLESTEROL TOTAL: 194 mg/dL (ref 100–199)
Chol/HDL Ratio: 2.9 ratio (ref 0.0–4.4)
HDL: 66 mg/dL (ref >39)
LDL Calculated: 103 mg/dL — ABNORMAL HIGH (ref 0–99)
TRIGLYCERIDES: 125 mg/dL (ref 0–149)
VLDL Cholesterol Cal: 25 mg/dL (ref 5–40)

## 2017-07-30 LAB — HEPATITIS C ANTIBODY: Hep C Virus Ab: <0.1 s/co ratio (ref 0.0–0.9)

## 2017-08-06 ENCOUNTER — Other Ambulatory Visit: Payer: Medicare Other

## 2018-01-28 ENCOUNTER — Telehealth: Payer: Self-pay | Admitting: Family Medicine

## 2018-01-28 DIAGNOSIS — R0981 Nasal congestion: Secondary | ICD-10-CM

## 2018-01-28 DIAGNOSIS — I10 Essential (primary) hypertension: Secondary | ICD-10-CM

## 2018-01-28 MED ORDER — FLUTICASONE PROPIONATE 50 MCG/ACT NA SUSP: 1.0000 | Freq: Two times a day (BID) | NASAL | 2 refills | Status: DC | PRN

## 2018-01-28 MED ORDER — LOSARTAN POTASSIUM-HCTZ 50-12.5 MG PO TABS: 1.0000 | ORAL_TABLET | Freq: Every day | ORAL | 0 refills | Status: DC

## 2018-01-28 NOTE — Telephone Encounter (Signed)
Pt aware refill sent into pharmacy 

## 2018-02-20 IMAGING — DX DG SHOULDER 2+V*L*
3 series · 3 of 3 positions shown · non-contrast
Comparison: None.

CLINICAL DATA: 70-year-old with chronic left shoulder pain. No
known injuries.

EXAM:
LEFT SHOULDER - 2+ VIEW

[shoulder ap]
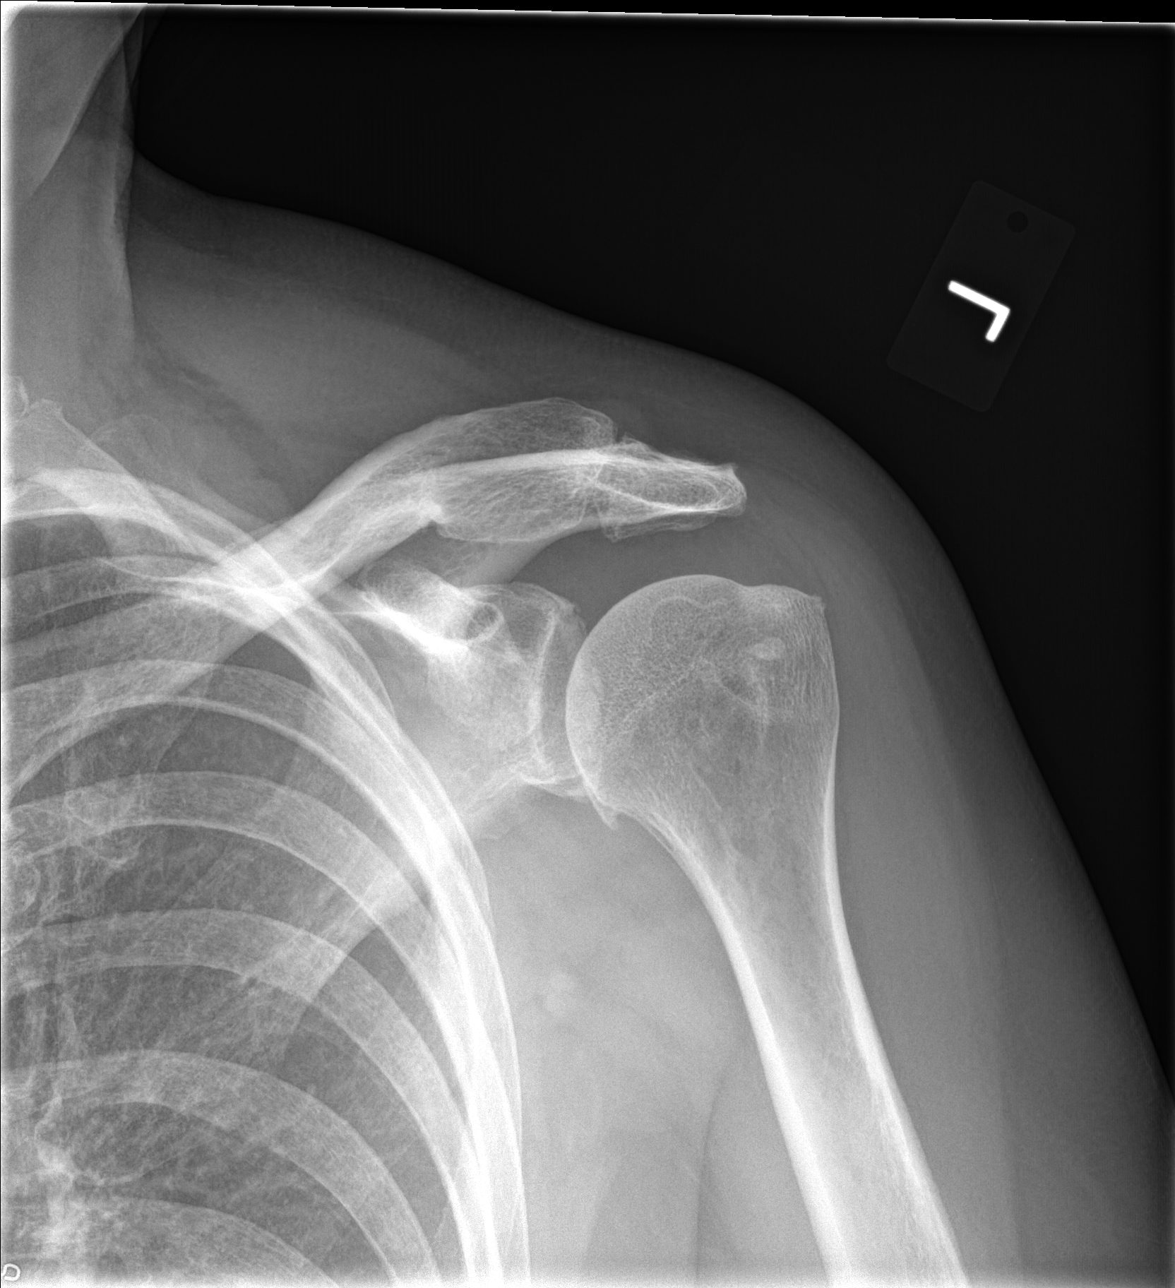

[shoulder obl]
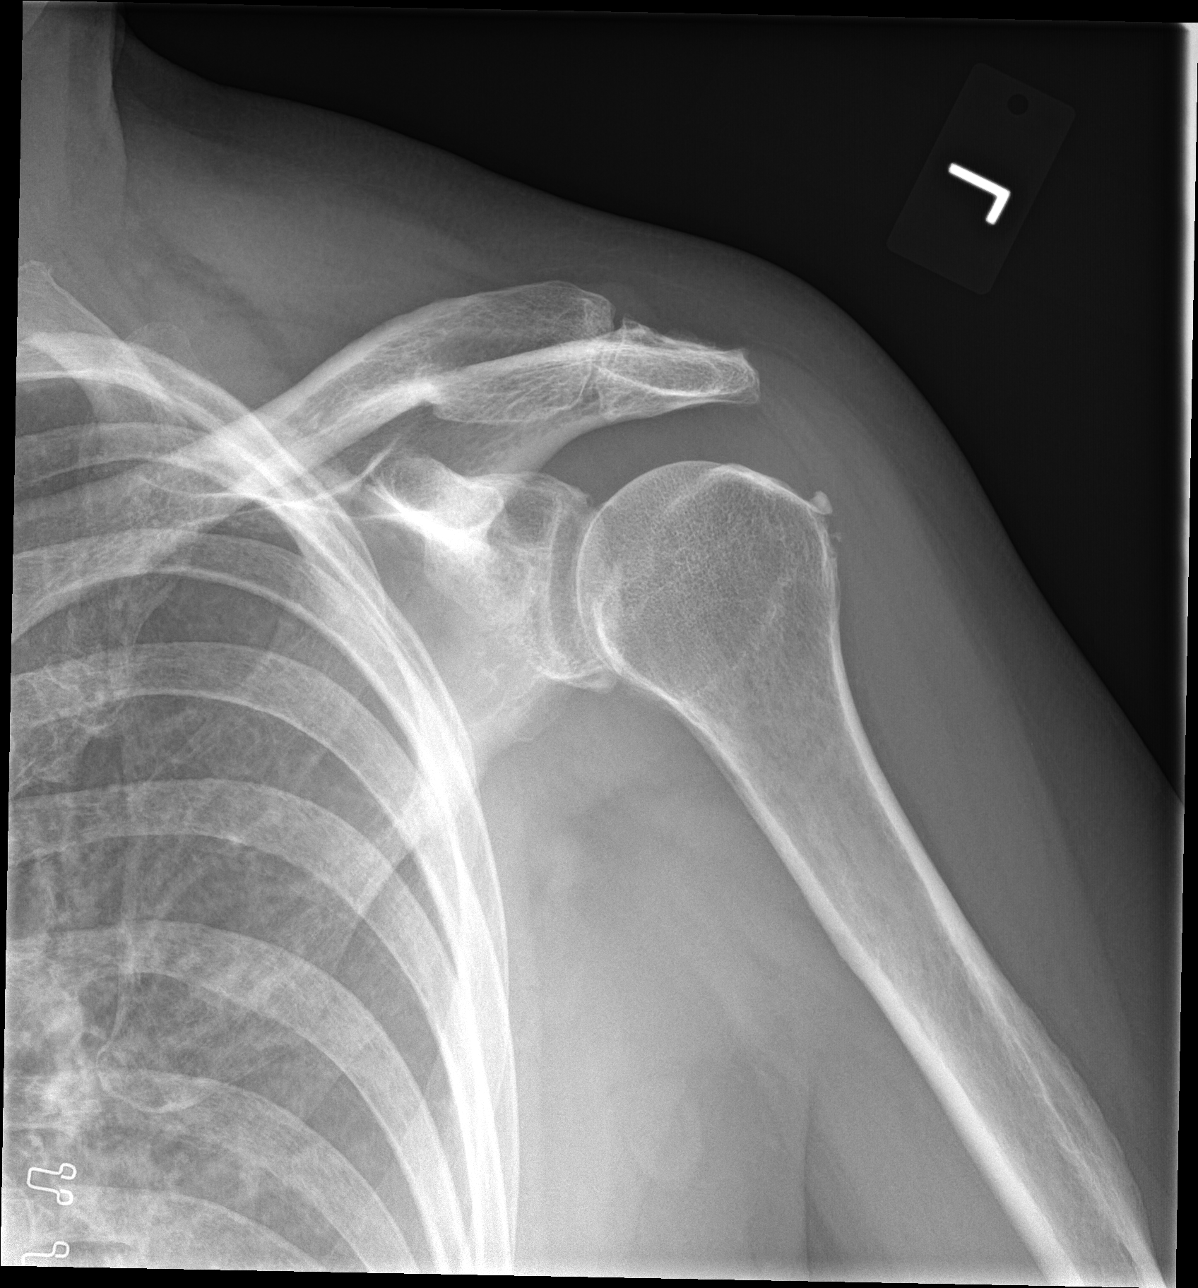

[shoulder swimmer]
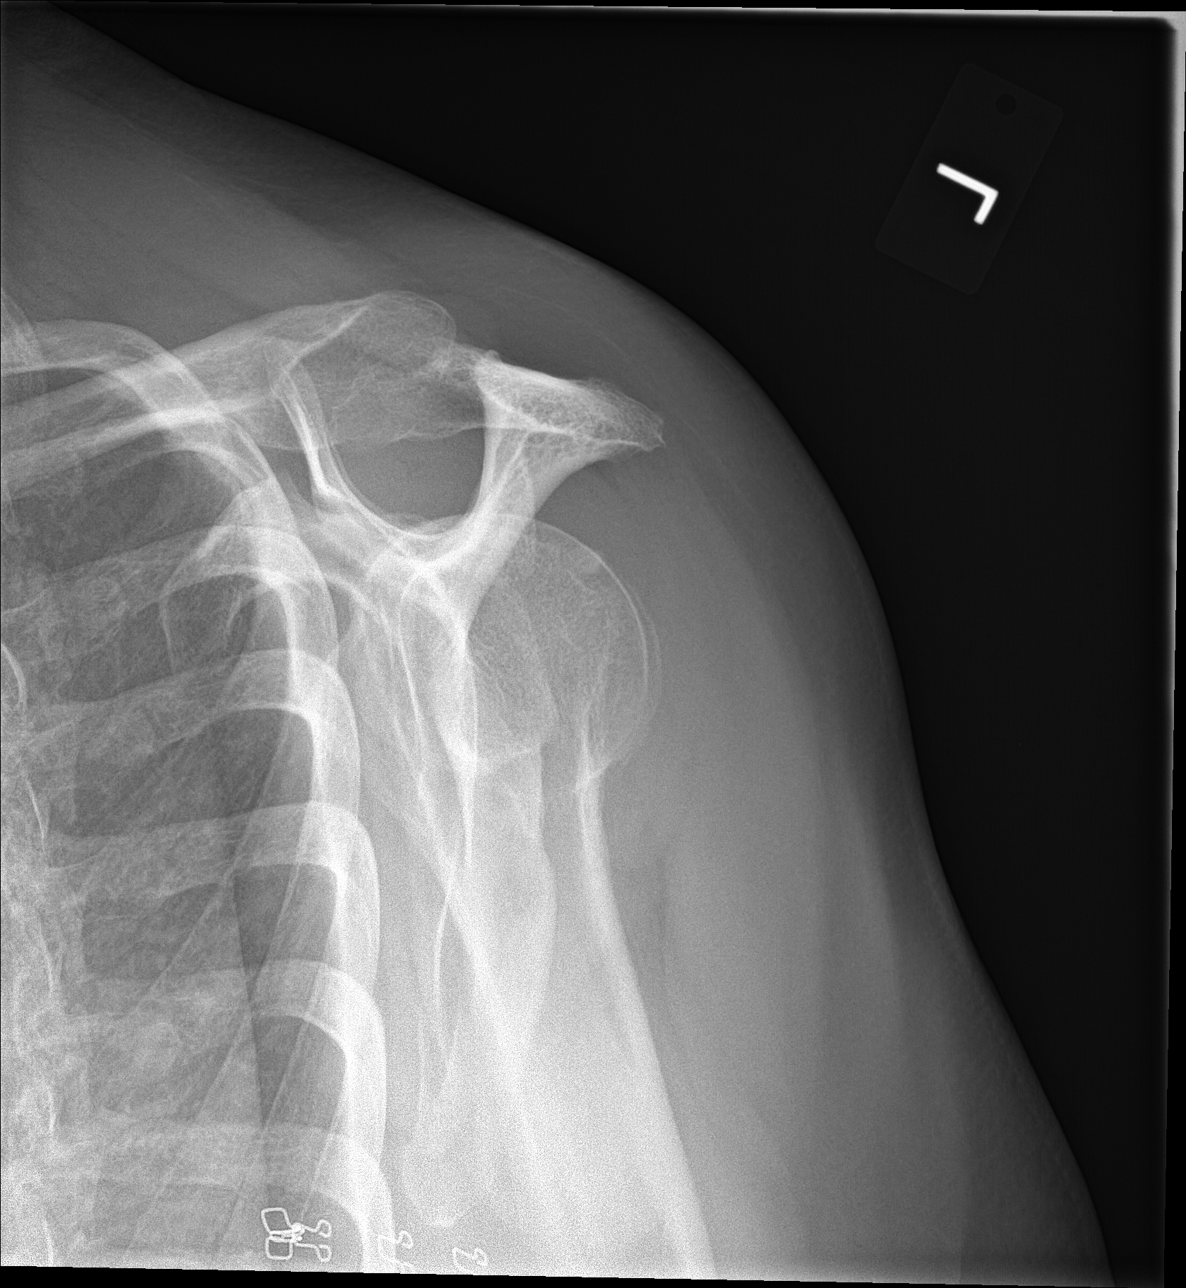

[3 of 3 positions shown; findings below may reference images not displayed]

FINDINGS: No evidence of acute, subacute or healed fractures. Glenohumeral
joint intact with mild to moderate degenerative changes including a
spur arising from the humeral head. Calcification at the insertion
of the supraspinatus tendon on the greater tuberosity.
Acromioclavicular joint intact with mild to moderate degenerative
changes.
IMPRESSION: 1. Calcific supraspinatus tendinitis.
2. Mild to moderate osteoarthritis involving the glenohumeral joint.
3. Mild to moderate degenerative changes involving the AC joint.

## 2018-02-24 ENCOUNTER — Encounter: Payer: Self-pay | Admitting: Family Medicine

## 2018-02-24 ENCOUNTER — Ambulatory Visit: Payer: Medicare Other | Admitting: Family Medicine

## 2018-02-24 VITALS — BP 148/93 | HR 88 | Temp 97.6°F | Ht 64.0 in | Wt 185.0 lb

## 2018-02-24 DIAGNOSIS — Z13220 Encounter for screening for lipoid disorders: Secondary | ICD-10-CM

## 2018-02-24 DIAGNOSIS — I10 Essential (primary) hypertension: Secondary | ICD-10-CM

## 2018-02-24 MED ORDER — LOSARTAN POTASSIUM-HCTZ 50-12.5 MG PO TABS: 1.0000 | ORAL_TABLET | Freq: Every day | ORAL | 1 refills | Status: DC

## 2018-02-24 NOTE — Progress Notes (Signed)
BP (!) 148/93   Pulse 88   Temp 97.6 F (36.4 C) (Oral)   Ht '5\' 4"'$  (1.626 m)   Wt 185 lb (83.9 kg)   BMI 31.76 kg/m    Subjective:    Patient ID: Beth Bryant, female    DOB: 1946-09-03, 72 y.o.   MRN: [id number]  HPI: Beth Bryant is a 72 y.o. female presenting on 02/24/2018 for Hypertension (6 mo)   HPI Hypertension Patient is currently on losartan hydrochlorothiazide, and their blood pressure today is 148/93 but she says it normally runs in the 130s over 70s at home, she says she was having some traffic troubles on the way here and it really got her blood pressure worked up.. Patient denies any lightheadedness or dizziness. Patient denies headaches, blurred vision, chest pains, shortness of breath, or weakness. Denies any side effects from medication and is content with current medication.   Relevant past medical, surgical, family and social history reviewed and updated as indicated. Interim medical history since our last visit reviewed. Allergies and medications reviewed and updated.  Review of Systems  Constitutional: Negative for chills and fever.  Eyes: Negative for visual disturbance.  Respiratory: Negative for chest tightness and shortness of breath.   Cardiovascular: Negative for chest pain and leg swelling.  Musculoskeletal: Negative for back pain and gait problem.  Skin: Negative for rash.  Neurological: Negative for seizures, weakness, light-headedness and headaches.  Psychiatric/Behavioral: Negative for agitation and behavioral problems.  All other systems reviewed and are negative.   Per HPI unless specifically indicated above   Allergies as of 02/24/2018   No Known Allergies     Medication List        Accurate as of 02/24/18  1:40 PM. Always use your most recent med list.          losartan-hydrochlorothiazide 50-12.5 MG tablet Commonly known as:  HYZAAR Take 1 tablet by mouth daily.          Objective:    BP (!) 148/93   Pulse  88   Temp 97.6 F (36.4 C) (Oral)   Ht '5\' 4"'$  (1.626 m)   Wt 185 lb (83.9 kg)   BMI 31.76 kg/m   Wt Readings from Last 3 Encounters:  02/24/18 185 lb (83.9 kg)  07/29/17 186 lb 8 oz (84.6 kg)  06/01/17 191 lb 12.8 oz (87 kg)    Physical Exam  Constitutional: She is oriented to person, place, and time. She appears well-developed and well-nourished. No distress.  Eyes: Conjunctivae are normal.  Neck: Normal range of motion. Neck supple. No thyromegaly present.  Cardiovascular: Normal rate, regular rhythm, normal heart sounds and intact distal pulses.  No murmur heard. Pulmonary/Chest: Effort normal and breath sounds normal. No respiratory distress. She has no wheezes.  Musculoskeletal: Normal range of motion. She exhibits no edema.  Lymphadenopathy:    She has no cervical adenopathy.  Neurological: She is alert and oriented to person, place, and time. Coordination normal.  Skin: Skin is warm and dry. No rash noted. She is not diaphoretic.  Psychiatric: She has a normal mood and affect. Her behavior is normal.  Nursing note and vitals reviewed.       Assessment & Plan:   Problem List Items Addressed This Visit      Cardiovascular and Mediastinum   Essential hypertension - Primary   Relevant Medications   losartan-hydrochlorothiazide (HYZAAR) 50-12.5 MG tablet   Other Relevant Orders   CMP14+EGFR    Other Visit Diagnoses  Lipid screening       Relevant Orders   Lipid panel     Patient refuses colonoscopy and mammogram and Tdap and DEXA scan today  Follow up plan: Return in about 6 months (around 08/26/2018), or if symptoms worsen or fail to improve, for Hypertension recheck.  Counseling provided for all of the vaccine components Orders Placed This Encounter  Procedures  . CMP14+EGFR  . Lipid panel    Caryl Pina, MD [facility] 02/24/2018, 1:40 PM

## 2018-02-25 LAB — LIPID PANEL
CHOL/HDL RATIO: 3 ratio (ref 0.0–4.4)
Cholesterol, Total: 207 mg/dL — ABNORMAL HIGH (ref 100–199)
HDL: 69 mg/dL (ref >39)
LDL Calculated: 100 mg/dL — ABNORMAL HIGH (ref 0–99)
TRIGLYCERIDES: 189 mg/dL — AB (ref 0–149)
VLDL CHOLESTEROL CAL: 38 mg/dL (ref 5–40)

## 2018-02-25 LAB — CMP14+EGFR
A/G RATIO: 1.2 (ref 1.2–2.2)
ALT: 35 IU/L — AB (ref 0–32)
AST: 41 IU/L — AB (ref 0–40)
Albumin: 4.3 g/dL (ref 3.6–4.8)
Alkaline Phosphatase: 67 IU/L (ref 39–117)
BUN/Creatinine Ratio: 16 (ref 12–28)
BUN: 13 mg/dL (ref 8–27)
Bilirubin Total: 0.4 mg/dL (ref 0.0–1.2)
CALCIUM: 9.8 mg/dL (ref 8.7–10.3)
CO2: 25 mmol/L (ref 20–29)
Chloride: 98 mmol/L (ref 96–106)
Creatinine, Ser: 0.82 mg/dL (ref 0.57–1.00)
GFR, EST AFRICAN AMERICAN: 84 mL/min/{1.73_m2} (ref >59)
GFR, EST NON AFRICAN AMERICAN: 73 mL/min/{1.73_m2} (ref >59)
Globulin, Total: 3.7 g/dL (ref 1.5–4.5)
Glucose: 83 mg/dL (ref 65–99)
POTASSIUM: 4.7 mmol/L (ref 3.5–5.2)
Sodium: 140 mmol/L (ref 134–144)
TOTAL PROTEIN: 8 g/dL (ref 6.0–8.5)

## 2018-07-27 ENCOUNTER — Encounter: Payer: Self-pay | Admitting: *Deleted

## 2018-08-30 ENCOUNTER — Ambulatory Visit: Payer: Medicare Other | Admitting: Family Medicine

## 2018-09-01 ENCOUNTER — Encounter: Payer: Self-pay | Admitting: Family Medicine

## 2018-09-01 ENCOUNTER — Ambulatory Visit: Payer: Medicare Other | Admitting: Family Medicine

## 2018-09-01 DIAGNOSIS — I10 Essential (primary) hypertension: Secondary | ICD-10-CM

## 2018-09-01 MED ORDER — LOSARTAN POTASSIUM-HCTZ 50-12.5 MG PO TABS: 1.0000 | ORAL_TABLET | Freq: Every day | ORAL | 3 refills | Status: DC

## 2018-09-01 NOTE — Progress Notes (Signed)
BP (!) 146/98   Pulse 97   Temp (!) 97.2 F (36.2 C) (Oral)   Ht _0  (1.626 m)   Wt 186 lb (84.4 kg)   BMI 31.93 kg/m    Subjective:    Patient ID: Beth Bryant, female    DOB: Feb 10, 1946, 72 y.o.   MRN: [id number]  HPI: Beth Bryant is a 72 y.o. female presenting on 09/01/2018 for Hypertension (6 month follow up) and trouble sleeping   HPI Hypertension Patient is currently on losartan hydrochlorothiazide, and their blood pressure today is 146/98. Patient denies any lightheadedness or dizziness. Patient denies headaches, blurred vision, chest pains, shortness of breath, or weakness. Denies any side effects from medication and is content with current medication.   Patient also complains of trouble sleeping today.  She says that sometimes she will wake up in the early morning hours and then she will just be up and not be able to go back to sleep like at 4:00 or 4:30 in the morning.  She says sometimes wakes her up as her anxiety and mind racing and then sometimes it her pain from arthritis sometimes and so things.  She does not wake up every morning like this but only every few mornings.  She denies it being a major issue but she does want to bring it up and mention it today.  Relevant past medical, surgical, family and social history reviewed and updated as indicated. Interim medical history since our last visit reviewed. Allergies and medications reviewed and updated.  Review of Systems  Constitutional: Negative for chills and fever.  Eyes: Negative for visual disturbance.  Respiratory: Negative for chest tightness and shortness of breath.   Cardiovascular: Negative for chest pain and leg swelling.  Musculoskeletal: Negative for back pain and gait problem.  Skin: Negative for rash.  Neurological: Negative for light-headedness and headaches.  Psychiatric/Behavioral: Positive for sleep disturbance. Negative for agitation, behavioral problems, self-injury and suicidal  ideas. The patient is not nervous/anxious.   All other systems reviewed and are negative.   Per HPI unless specifically indicated above   Allergies as of 09/01/2018   No Known Allergies     Medication List        Accurate as of 09/01/18  1:39 PM. Always use your most recent med list.          Fish Oil 1000 MG Caps Take by mouth.   losartan-hydrochlorothiazide 50-12.5 MG tablet Commonly known as:  HYZAAR Take 1 tablet by mouth daily.   OVER THE COUNTER MEDICATION Complete multi - I daily          Objective:    BP (!) 146/98   Pulse 97   Temp (!) 97.2 F (36.2 C) (Oral)   Ht _1  (1.626 m)   Wt 186 lb (84.4 kg)   BMI 31.93 kg/m   Wt Readings from Last 3 Encounters:  09/01/18 186 lb (84.4 kg)  02/24/18 185 lb (83.9 kg)  07/29/17 186 lb 8 oz (84.6 kg)    Physical Exam  Constitutional: She is oriented to person, place, and time. She appears well-developed and well-nourished. No distress.  Eyes: Conjunctivae are normal.  Neck: Neck supple. No thyromegaly present.  Cardiovascular: Normal rate, regular rhythm, normal heart sounds and intact distal pulses.  No murmur heard. Pulmonary/Chest: Effort normal and breath sounds normal. No respiratory distress. She has no wheezes.  Musculoskeletal: Normal range of motion. She exhibits no edema.  Lymphadenopathy:    She has no  cervical adenopathy.  Neurological: She is alert and oriented to person, place, and time. Coordination normal.  Skin: Skin is warm and dry. No rash noted. She is not diaphoretic.  Psychiatric: She has a normal mood and affect. Her behavior is normal.  Nursing note and vitals reviewed.      Assessment & Plan:   Problem List Items Addressed This Visit      Cardiovascular and Mediastinum   Essential hypertension   Relevant Medications   losartan-hydrochlorothiazide (HYZAAR) 50-12.5 MG tablet   Other Relevant Orders   CMP14+EGFR   Lipid panel       Follow up plan: Return in about 6  months (around 03/03/2019), or if symptoms worsen or fail to improve, for Hypertension.  Counseling provided for all of the vaccine components No orders of the defined types were placed in this encounter.   Caryl Pina, MD [facility] 09/01/2018, 1:39 PM

## 2018-09-02 LAB — CMP14+EGFR
ALT: 32 IU/L (ref 0–32)
AST: 30 IU/L (ref 0–40)
Albumin/Globulin Ratio: 1.2 (ref 1.2–2.2)
Albumin: 4.4 g/dL (ref 3.5–4.8)
Alkaline Phosphatase: 67 IU/L (ref 39–117)
BUN/Creatinine Ratio: 16 (ref 12–28)
BUN: 14 mg/dL (ref 8–27)
Bilirubin Total: 0.5 mg/dL (ref 0.0–1.2)
CALCIUM: 9.8 mg/dL (ref 8.7–10.3)
CO2: 26 mmol/L (ref 20–29)
CREATININE: 0.88 mg/dL (ref 0.57–1.00)
Chloride: 98 mmol/L (ref 96–106)
GFR calc non Af Amer: 67 mL/min/{1.73_m2} (ref >59)
GFR, EST AFRICAN AMERICAN: 77 mL/min/{1.73_m2} (ref >59)
Globulin, Total: 3.7 g/dL (ref 1.5–4.5)
Glucose: 78 mg/dL (ref 65–99)
Potassium: 4.8 mmol/L (ref 3.5–5.2)
Sodium: 138 mmol/L (ref 134–144)
Total Protein: 8.1 g/dL (ref 6.0–8.5)

## 2018-09-02 LAB — LIPID PANEL
Chol/HDL Ratio: 2.7 ratio (ref 0.0–4.4)
Cholesterol, Total: 202 mg/dL — ABNORMAL HIGH (ref 100–199)
HDL: 75 mg/dL (ref >39)
LDL CALC: 101 mg/dL — AB (ref 0–99)
Triglycerides: 129 mg/dL (ref 0–149)
VLDL CHOLESTEROL CAL: 26 mg/dL (ref 5–40)

## 2019-03-07 ENCOUNTER — Other Ambulatory Visit: Payer: Self-pay

## 2019-03-07 ENCOUNTER — Encounter: Payer: Self-pay | Admitting: Family Medicine

## 2019-03-07 ENCOUNTER — Ambulatory Visit (INDEPENDENT_AMBULATORY_CARE_PROVIDER_SITE_OTHER): Payer: Medicare Other | Admitting: Family Medicine

## 2019-03-07 DIAGNOSIS — I10 Essential (primary) hypertension: Secondary | ICD-10-CM | POA: Diagnosis not present

## 2019-03-07 NOTE — Progress Notes (Signed)
   Virtual Visit via telephone Note  I connected with Beth Bryant on 03/07/19 at 1258 by telephone and verified that I am speaking with the correct person using two identifiers. Beth Bryant is currently located at home and no other people are currently with her during visit. The provider, Elige Radon Anyelo Mccue, MD is located in their office at time of visit.  Call ended at 1309  I discussed the limitations, risks, security and privacy concerns of performing an evaluation and management service by telephone and the availability of in person appointments. I also discussed with the patient that there may be a patient responsible charge related to this service. The patient expressed understanding and agreed to proceed.   History and Present Illness: 114/62-123/85 at home Hypertension Patient is currently on losartan-hctz, and their blood pressure today is 114/62. Patient denies any lightheadedness or dizziness. Patient denies headaches, blurred vision, chest pains, shortness of breath, or weakness. Denies any side effects from medication and is content with current medication.   No diagnosis found.  Outpatient Encounter Medications as of 03/07/2019  Medication Sig  . losartan-hydrochlorothiazide (HYZAAR) 50-12.5 MG tablet Take 1 tablet by mouth daily.  . Omega-3 Fatty Acids (FISH OIL) 1000 MG CAPS Take by mouth.  Marland Kitchen OVER THE COUNTER MEDICATION Complete multi - I daily   No facility-administered encounter medications on file as of 03/07/2019.     Review of Systems  Constitutional: Negative for chills and fever.  Eyes: Negative for discharge.  Respiratory: Negative for shortness of breath and wheezing.   Cardiovascular: Negative for chest pain and leg swelling.  Musculoskeletal: Negative for back pain and gait problem.  Skin: Negative for rash.  All other systems reviewed and are negative.   Observations/Objective: Patient sounds comfortable and in no acute distress   Assessment and Plan: Problem List Items Addressed This Visit      Cardiovascular and Mediastinum   Essential hypertension - Primary       Follow Up Instructions: Follow up in 6 months  Continue hyzaar   I discussed the assessment and treatment plan with the patient. The patient was provided an opportunity to ask questions and all were answered. The patient agreed with the plan and demonstrated an understanding of the instructions.   The patient was advised to call back or seek an in-person evaluation if the symptoms worsen or if the condition fails to improve as anticipated.  The above assessment and management plan was discussed with the patient. The patient verbalized understanding of and has agreed to the management plan. Patient is aware to call the clinic if symptoms persist or worsen. Patient is aware when to return to the clinic for a follow-up visit. Patient educated on when it is appropriate to go to the emergency department.    I provided 11 minutes of non-face-to-face time during this encounter.    Nils Pyle, MD

## 2019-03-31 ENCOUNTER — Other Ambulatory Visit: Payer: Self-pay

## 2019-03-31 ENCOUNTER — Ambulatory Visit (INDEPENDENT_AMBULATORY_CARE_PROVIDER_SITE_OTHER): Payer: Medicare Other | Admitting: *Deleted

## 2019-03-31 DIAGNOSIS — Z0000 Encounter for general adult medical examination without abnormal findings: Secondary | ICD-10-CM | POA: Diagnosis not present

## 2019-03-31 NOTE — Progress Notes (Signed)
MEDICARE ANNUAL WELLNESS VISIT  03/31/2019  Telephone Visit Disclaimer This Medicare AWV was conducted by telephone due to national recommendations for restrictions regarding the COVID-19 Pandemic (e.g. social distancing).  I verified, using two identifiers, that I am speaking with Beth Bryant or their authorized healthcare agent. I discussed the limitations, risks, security, and privacy concerns of performing an evaluation and management service by telephone and the potential availability of an in-person appointment in the future. The patient expressed understanding and agreed to proceed.   Subjective:  Beth Bryant is a 73 y.o. adult patient of Dettinger, Elige Radon, MD who had a Medicare Annual Wellness Visit today via telephone. Beth Bryant is Retired and lives with their spouse. Beth Bryant has 2 children. Beth Bryant reports that Beth Bryant is socially active and does interact with friends/family regularly. Beth Bryant is not physically active and enjoys gardening and bird watching.  Patient Care Team: Dettinger, Elige Radon, MD as PCP - General (Family Medicine)  Advanced Directives 03/31/2019  Does Patient Have a Medical Advance Directive? Yes  Type of Advance Directive Healthcare Power of Attorney  Copy of Healthcare Power of Attorney in Chart? No - copy requested    Hospital Utilization Over the Past 12 Months: # of hospitalizations or ER visits: 0 # of surgeries: 0  Review of Systems    Patient reports that his overall health is unchanged compared to last year.  Patient Reported Readings (BP, Pulse, CBG, Weight, etc) none  Review of Systems: No complaints  All other systems negative.  Pain Assessment Pain : No/denies pain     Current Medications & Allergies (verified) Allergies as of 03/31/2019   No Known Allergies     Medication List       Accurate as of Mar 31, 2019  9:37 AM. If you have any questions, ask your nurse or doctor.        Fish Oil 1000 MG Caps Take by mouth.    losartan-hydrochlorothiazide 50-12.5 MG tablet Commonly known as:  HYZAAR Take 1 tablet by mouth daily.   multivitamin capsule Take 1 capsule by mouth daily.   OVER THE COUNTER MEDICATION Complete multi - I daily   vitamin C 100 MG tablet Take 100 mg by mouth daily.       History (reviewed): Past Medical History:  Diagnosis Date  . Arthritis   . DDD (degenerative disc disease), lumbar   . Hypertension   . Scoliosis    Past Surgical History:  Procedure Laterality Date  . hemorrhoid surg.     . TUBAL LIGATION     Family History  Problem Relation Age of Onset  . Stroke Mother   . Hypertension Mother   . Heart disease Father        MI at 87  . Arthritis Brother   . Heart disease Brother   . Epilepsy Son   . Cerebral palsy Son   . Learning disabilities Son    Social History   Socioeconomic History  . Marital status: Married    Spouse name: Beth Bryant  . Number of children: 2  . Years of education: Not on file  . Highest education level: High school graduate  Occupational History  . Occupation: Retired  Engineer, production  . Financial resource strain: Not hard at all  . Food insecurity:    Worry: Never true    Inability: Never true  . Transportation needs:    Medical: No    Non-medical: No  Tobacco Use  . Smoking status:  Never Smoker  . Smokeless tobacco: Never Used  Substance and Sexual Activity  . Alcohol use: No    Alcohol/week: 0.0 standard drinks  . Drug use: No  . Sexual activity: Yes    Birth control/protection: Surgical  Lifestyle  . Physical activity:    Days per week: 0 days    Minutes per session: 0 min  . Stress: Not at all  Relationships  . Social connections:    Talks on phone: More than three times a week    Gets together: Twice a week    Attends religious service: Never    Active member of club or organization: No    Attends meetings of clubs or organizations: Never    Relationship status: Married  Other Topics Concern  . Not on file   Social History Narrative  . Not on file    Activities of Daily Living In your present state of health, do you have any difficulty performing the following activities: 03/31/2019  Hearing? N  Vision? N  Difficulty concentrating or making decisions? N  Walking or climbing stairs? N  Dressing or bathing? N  Doing errands, shopping? N  Preparing Food and eating ? N  Using the Toilet? N  In the past six months, have you accidently leaked urine? N  Do you have problems with loss of bowel control? N  Managing your Medications? N  Managing your Finances? N  Housekeeping or managing your Housekeeping? N  Some recent data might be hidden    Patient Literacy How often do you need to have someone help you when you read instructions, pamphlets, or other written materials from your doctor or pharmacy?: 1 - Never What is the last grade level you completed in school?: 12th grade  Exercise Current Exercise Habits: The patient does not participate in regular exercise at present, Exercise limited by: orthopedic condition(s)  Diet Patient reports consuming 2 meals a day and 2 snack(s) a day Patient reports that his primary diet is: Regular Patient reports that Beth Bryant does have regular access to food.   Depression Screen PHQ 2/9 Scores 03/31/2019 09/01/2018 02/24/2018 07/29/2017 06/01/2017 01/26/2017 10/17/2016  PHQ - 2 Score 0 0 0 0 0 0 0     Fall Risk Fall Risk  03/31/2019 09/01/2018 02/24/2018 07/29/2017 06/01/2017  Falls in the past year? 0 No No No No     Objective:  Beth SaaCharlotte Bryant seemed alert and oriented and he participated appropriately during our telephone visit.  Blood Pressure Weight BMI  BP Readings from Last 3 Encounters:  09/01/18 (!) 146/98  02/24/18 (!) 148/93  07/29/17 115/76   Wt Readings from Last 3 Encounters:  09/01/18 186 lb (84.4 kg)  02/24/18 185 lb (83.9 kg)  07/29/17 186 lb 8 oz (84.6 kg)   BMI Readings from Last 1 Encounters:  09/01/18 31.93 kg/m    *Unable  to obtain current vital signs, weight, and BMI due to telephone visit type  Hearing/Vision  . Beth Bryant did not seem to have difficulty with hearing/understanding during the telephone conversation . Reports that he has not had a formal eye exam by an eye care professional within the past year . Reports that he has not had a formal hearing evaluation within the past year *Unable to fully assess hearing and vision during telephone visit type  Cognitive Function: 6CIT Screen 03/31/2019  What Year? 0 points  What month? 0 points  What time? 0 points  Count back from 20 0 points  Months in  reverse 0 points  Repeat phrase 0 points  Total Score 0    Normal Cognitive Function Screening: Yes (Normal:0-7, Significant for Dysfunction: >8)  Immunization & Health Maintenance Record There is no immunization history for the selected administration types on file for this patient.  Health Maintenance  Topic Date Due  . MAMMOGRAM  03/30/2020 (Originally 02/27/1998)  . DEXA SCAN  03/30/2020 (Originally 02/27/2013)  . COLONOSCOPY  03/30/2020 (Originally 02/27/1998)  . TETANUS/TDAP  03/30/2020 (Originally 02/28/1967)  . PNA vac Low Risk Adult (1 of 2 - PCV13) 03/30/2020 (Originally 02/27/2013)  . INFLUENZA VACCINE  06/11/2019  . Hepatitis C Screening  Completed       Assessment  This is a routine wellness examination for [facility].  Health Maintenance: Due or Overdue There are no preventive care reminders to display for this patient.  Beth Bryant does not need a referral for Community Assistance: Care Management:   no Social Work:    no Prescription Assistance:  no Nutrition/Diabetes Education:  no   Plan:  Personalized Goals Goals Addressed            This Visit's Progress   . Weight (lb) < 150 lb (68 kg)       Pt states Beth Bryant used to weigh 125 but Beth Bryant would really like to try and get to less than 150lbs      Personalized Health Maintenance & Screening  Recommendations  Pt declines all recommended vaccines, declines DEXA, declines Mammo and decline Colonoscopy.  Lung Cancer Screening Recommended: no (Low Dose CT Chest recommended if Age 53-80 years, 30 pack-year currently smoking OR have quit w/in past 15 years) Hepatitis C Screening recommended: no HIV Screening recommended: no  Advanced Directives: Written information was not prepared per patient's request.  Referrals & Orders Orders Placed This Encounter  Procedures  . Tdap vaccine greater than or equal to 7yo IM  . Varicella-zoster vaccine IM (Shingrix)  . Pneumococcal conjugate vaccine 13-valent    Follow-up Plan . Follow-up with Dettinger, Elige Radon, MD as planned . Bring in a copy of your Advanced Directives when you come to your next office visit with PCP     I have personally reviewed and noted the following in the patient's chart:   . Medical and social history . Use of alcohol, tobacco or illicit drugs  . Current medications and supplements . Functional ability and status . Nutritional status . Physical activity . Advanced directives . List of other physicians . Hospitalizations, surgeries, and ER visits in previous 12 months . Vitals . Screenings to include cognitive, depression, and falls . Referrals and appointments  In addition, I have reviewed and discussed with Beth Bryant certain preventive protocols, quality metrics, and best practice recommendations. A written personalized care plan for preventive services as well as general preventive health recommendations is available and can be mailed to the patient at his request.      signature  03/31/2019

## 2019-03-31 NOTE — Patient Instructions (Signed)
Preventive Care 71 Years and Older, Female Preventive care refers to lifestyle choices and visits with your health care provider that can promote health and wellness. What does preventive care include?  A yearly physical exam. This is also called an annual well check.  Dental exams once or twice a year.  Routine eye exams. Ask your health care provider how often you should have your eyes checked.  Personal lifestyle choices, including: ? Daily care of your teeth and gums. ? Regular physical activity. ? Eating a healthy diet. ? Avoiding tobacco and drug use. ? Limiting alcohol use. ? Practicing safe sex. ? Taking low-dose aspirin every day. ? Taking vitamin and mineral supplements as recommended by your health care provider. What happens during an annual well check? The services and screenings done by your health care provider during your annual well check will depend on your age, overall health, lifestyle risk factors, and family history of disease. Counseling Your health care provider may ask you questions about your:  Alcohol use.  Tobacco use.  Drug use.  Emotional well-being.  Home and relationship well-being.  Sexual activity.  Eating habits.  History of falls.  Memory and ability to understand (cognition).  Work and work Statistician.  Reproductive health.  Screening You may have the following tests or measurements:  Height, weight, and BMI.  Blood pressure.  Lipid and cholesterol levels. These may be checked every 5 years, or more frequently if you are over 15 years old.  Skin check.  Lung cancer screening. You may have this screening every year starting at age 80 if you have a 30-pack-year history of smoking and currently smoke or have quit within the past 15 years.  Colorectal cancer screening. All adults should have this screening starting at age 8 and continuing until age 25. You will have tests every 1-10 years, depending on your results and the  type of screening test. People at increased risk should start screening at an earlier age. Screening tests may include: ? Guaiac-based fecal occult blood testing. ? Fecal immunochemical test (FIT). ? Stool DNA test. ? Virtual colonoscopy. ? Sigmoidoscopy. During this test, a flexible tube with a tiny camera (sigmoidoscope) is used to examine your rectum and lower colon. The sigmoidoscope is inserted through your anus into your rectum and lower colon. ? Colonoscopy. During this test, a long, thin, flexible tube with a tiny camera (colonoscope) is used to examine your entire colon and rectum.  Hepatitis C blood test.  Hepatitis B blood test.  Sexually transmitted disease (STD) testing.  Diabetes screening. This is done by checking your blood sugar (glucose) after you have not eaten for a while (fasting). You may have this done every 1-3 years.  Bone density scan. This is done to screen for osteoporosis. You may have this done starting at age 43.  Mammogram. This may be done every 1-2 years. Talk to your health care provider about how often you should have regular mammograms. Talk with your health care provider about your test results, treatment options, and if necessary, the need for more tests. Vaccines Your health care provider may recommend certain vaccines, such as:  Influenza vaccine. This is recommended every year.  Tetanus, diphtheria, and acellular pertussis (Tdap, Td) vaccine. You may need a Td booster every 10 years.  Varicella vaccine. You may need this if you have not been vaccinated.  Zoster vaccine. You may need this after age 27.  Measles, mumps, and rubella (MMR) vaccine. You may need at least  one dose of MMR if you were born in 1957 or later. You may also need a second dose.  Pneumococcal 13-valent conjugate (PCV13) vaccine. One dose is recommended after age 40.  Pneumococcal polysaccharide (PPSV23) vaccine. One dose is recommended after age 34.  Meningococcal  vaccine. You may need this if you have certain conditions.  Hepatitis A vaccine. You may need this if you have certain conditions or if you travel or work in places where you may be exposed to hepatitis A.  Hepatitis B vaccine. You may need this if you have certain conditions or if you travel or work in places where you may be exposed to hepatitis B.  Haemophilus influenzae type b (Hib) vaccine. You may need this if you have certain conditions. Talk to your health care provider about which screenings and vaccines you need and how often you need them. This information is not intended to replace advice given to you by your health care provider. Make sure you discuss any questions you have with your health care provider. Document Released: 11/23/2015 Document Revised: 12/17/2017 Document Reviewed: 08/28/2015 Elsevier Interactive Patient Education  2019 [employer].

## 2019-08-01 ENCOUNTER — Other Ambulatory Visit: Payer: Self-pay | Admitting: Family Medicine

## 2019-08-01 DIAGNOSIS — I10 Essential (primary) hypertension: Secondary | ICD-10-CM

## 2019-09-06 ENCOUNTER — Other Ambulatory Visit: Payer: Self-pay

## 2019-09-07 ENCOUNTER — Encounter: Payer: Self-pay | Admitting: Family Medicine

## 2019-09-07 ENCOUNTER — Ambulatory Visit (INDEPENDENT_AMBULATORY_CARE_PROVIDER_SITE_OTHER): Payer: Medicare Other | Admitting: Family Medicine

## 2019-09-07 VITALS — BP 126/85 | HR 94 | Temp 98.6°F | Ht 64.0 in | Wt 198.6 lb

## 2019-09-07 DIAGNOSIS — I10 Essential (primary) hypertension: Secondary | ICD-10-CM

## 2019-09-07 DIAGNOSIS — Z13220 Encounter for screening for lipoid disorders: Secondary | ICD-10-CM

## 2019-09-07 MED ORDER — LOSARTAN POTASSIUM-HCTZ 50-12.5 MG PO TABS: 1.0000 | ORAL_TABLET | Freq: Every day | ORAL | 3 refills | Status: DC

## 2019-09-07 NOTE — Progress Notes (Signed)
BP 126/85   Pulse 94   Temp 98.6 F (37 C) (Temporal)   Ht '5\' 4"'$  (1.626 m)   Wt 198 lb 9.6 oz (90.1 kg)   SpO2 95%   BMI 34.09 kg/m    Subjective:   Patient ID: Beth Bryant, adult    DOB: [birth year], 73 y.o.   MRN: [id number]  HPI: Beth Bryant is a 73 y.o. adult presenting on 09/07/2019 for Hypertension (6 month)   HPI Hypertension Patient is currently on losartan-hydrochlorothiazide, and their blood pressure today is 126/85. Patient denies any lightheadedness or dizziness. Patient denies headaches, blurred vision, chest pains, shortness of breath, or weakness. Denies any side effects from medication and is content with current medication.   Relevant past medical, surgical, family and social history reviewed and updated as indicated. Interim medical history since our last visit reviewed. Allergies and medications reviewed and updated.  Review of Systems  Constitutional: Negative for chills and fever.  Respiratory: Negative for shortness of breath and wheezing.   Cardiovascular: Negative for chest pain and leg swelling.  Musculoskeletal: Negative for back pain and gait problem.  Skin: Negative for rash.  Neurological: Negative for dizziness, weakness and light-headedness.  All other systems reviewed and are negative.   Per HPI unless specifically indicated above   Allergies as of 09/07/2019   No Known Allergies     Medication List       Accurate as of September 07, 2019  2:01 PM. If you have any questions, ask your nurse or doctor.        Fish Oil 1000 MG Caps Take by mouth.   losartan-hydrochlorothiazide 50-12.5 MG tablet Commonly known as: HYZAAR Take 1 tablet by mouth daily.   multivitamin capsule Take 1 capsule by mouth daily.   OVER THE COUNTER MEDICATION Complete multi - I daily   vitamin C 100 MG tablet Take 100 mg by mouth daily.        Objective:   BP 126/85   Pulse 94   Temp 98.6 F (37 C) (Temporal)   Ht '5\' 4"'$  (1.626  m)   Wt 198 lb 9.6 oz (90.1 kg)   SpO2 95%   BMI 34.09 kg/m   Wt Readings from Last 3 Encounters:  09/07/19 198 lb 9.6 oz (90.1 kg)  09/01/18 186 lb (84.4 kg)  02/24/18 185 lb (83.9 kg)    Physical Exam Vitals signs and nursing note reviewed.  Constitutional:      General: He is not in acute distress.    Appearance: He is well-developed. He is not diaphoretic.  Eyes:     General: No scleral icterus.    Conjunctiva/sclera: Conjunctivae normal.  Neck:     Musculoskeletal: Neck supple.     Thyroid: No thyromegaly.  Cardiovascular:     Rate and Rhythm: Normal rate and regular rhythm.     Heart sounds: Normal heart sounds. No murmur.  Pulmonary:     Effort: Pulmonary effort is normal. No respiratory distress.     Breath sounds: Normal breath sounds. No wheezing.  Musculoskeletal: Normal range of motion.  Lymphadenopathy:     Cervical: No cervical adenopathy.  Skin:    General: Skin is warm and dry.     Findings: No rash.  Neurological:     Mental Status: He is alert and oriented to person, place, and time.     Coordination: Coordination normal.  Psychiatric:        Behavior: Behavior normal.  Assessment & Plan:   Problem List Items Addressed This Visit      Cardiovascular and Mediastinum   Essential hypertension   Relevant Medications   losartan-hydrochlorothiazide (HYZAAR) 50-12.5 MG tablet   Other Relevant Orders   CBC with Differential/Platelet   CMP14+EGFR    Other Visit Diagnoses    Lipid screening    -  Primary   Relevant Orders   Lipid panel      Blood pressure looks great today, continue current blood pressure medication Follow up plan: Return in about 6 months (around 03/07/2020), or if symptoms worsen or fail to improve, for Hypertension recheck.  Counseling provided for all of the vaccine components Orders Placed This Encounter  Procedures  . CBC with Differential/Platelet  . CMP14+EGFR  . Lipid panel    Caryl Pina, MD [facility] 09/07/2019, 2:01 PM

## 2019-09-08 LAB — CBC WITH DIFFERENTIAL/PLATELET
Basophils Absolute: 0 10*3/uL (ref 0.0–0.2)
Basos: 1 %
EOS (ABSOLUTE): 0.2 10*3/uL (ref 0.0–0.4)
Eos: 2 %
Hematocrit: 41.3 % (ref 34.0–46.6)
Hemoglobin: 14.3 g/dL (ref 11.1–15.9)
Immature Grans (Abs): 0 10*3/uL (ref 0.0–0.1)
Immature Granulocytes: 0 %
Lymphocytes Absolute: 2.5 10*3/uL (ref 0.7–3.1)
Lymphs: 35 %
MCH: 31.7 pg (ref 26.6–33.0)
MCHC: 34.6 g/dL (ref 31.5–35.7)
MCV: 92 fL (ref 79–97)
Monocytes Absolute: 0.7 10*3/uL (ref 0.1–0.9)
Monocytes: 10 %
Neutrophils Absolute: 3.7 10*3/uL (ref 1.4–7.0)
Neutrophils: 52 %
Platelets: 196 10*3/uL (ref 150–450)
RBC: 4.51 x10E6/uL (ref 3.77–5.28)
RDW: 12.5 % (ref 11.7–15.4)
WBC: 7.2 10*3/uL (ref 3.4–10.8)

## 2019-09-08 LAB — CMP14+EGFR
ALT: 70 IU/L — ABNORMAL HIGH (ref 0–32)
AST: 68 IU/L — ABNORMAL HIGH (ref 0–40)
Albumin/Globulin Ratio: 1.3 (ref 1.2–2.2)
Albumin: 4.4 g/dL (ref 3.7–4.7)
Alkaline Phosphatase: 65 IU/L (ref 39–117)
BUN/Creatinine Ratio: 14 (ref 12–28)
BUN: 15 mg/dL (ref 8–27)
Bilirubin Total: 0.5 mg/dL (ref 0.0–1.2)
CO2: 26 mmol/L (ref 20–29)
Calcium: 9.8 mg/dL (ref 8.7–10.3)
Chloride: 97 mmol/L (ref 96–106)
Creatinine, Ser: 1.08 mg/dL — ABNORMAL HIGH (ref 0.57–1.00)
GFR calc Af Amer: 60 mL/min/{1.73_m2} (ref >59)
GFR calc non Af Amer: 52 mL/min/{1.73_m2} — ABNORMAL LOW (ref >59)
Globulin, Total: 3.5 g/dL (ref 1.5–4.5)
Glucose: 87 mg/dL (ref 65–99)
Potassium: 5 mmol/L (ref 3.5–5.2)
Sodium: 136 mmol/L (ref 134–144)
Total Protein: 7.9 g/dL (ref 6.0–8.5)

## 2019-09-08 LAB — LIPID PANEL
Chol/HDL Ratio: 2.6 ratio (ref 0.0–4.4)
Cholesterol, Total: 209 mg/dL — ABNORMAL HIGH (ref 100–199)
HDL: 79 mg/dL (ref >39)
LDL Chol Calc (NIH): 106 mg/dL — ABNORMAL HIGH (ref 0–99)
Triglycerides: 140 mg/dL (ref 0–149)
VLDL Cholesterol Cal: 24 mg/dL (ref 5–40)

## 2019-09-12 ENCOUNTER — Telehealth: Payer: Self-pay | Admitting: Family Medicine

## 2019-09-12 DIAGNOSIS — I10 Essential (primary) hypertension: Secondary | ICD-10-CM

## 2019-09-12 MED ORDER — LOSARTAN POTASSIUM-HCTZ 50-12.5 MG PO TABS: 1.0000 | ORAL_TABLET | Freq: Every day | ORAL | 3 refills | Status: DC

## 2019-09-12 NOTE — Telephone Encounter (Signed)
Patient aware and verbalized understanding. °

## 2019-09-27 ENCOUNTER — Other Ambulatory Visit: Payer: Medicare Other

## 2019-09-27 ENCOUNTER — Other Ambulatory Visit: Payer: Self-pay

## 2019-09-27 DIAGNOSIS — R7989 Other specified abnormal findings of blood chemistry: Secondary | ICD-10-CM

## 2019-09-28 LAB — CMP14+EGFR
ALT: 69 IU/L — ABNORMAL HIGH (ref 0–32)
AST: 57 IU/L — ABNORMAL HIGH (ref 0–40)
Albumin/Globulin Ratio: 1.1 — ABNORMAL LOW (ref 1.2–2.2)
Albumin: 4 g/dL (ref 3.7–4.7)
Alkaline Phosphatase: 67 IU/L (ref 39–117)
BUN/Creatinine Ratio: 12 (ref 12–28)
BUN: 12 mg/dL (ref 8–27)
Bilirubin Total: 0.5 mg/dL (ref 0.0–1.2)
CO2: 26 mmol/L (ref 20–29)
Calcium: 9.9 mg/dL (ref 8.7–10.3)
Chloride: 99 mmol/L (ref 96–106)
Creatinine, Ser: 0.97 mg/dL (ref 0.57–1.00)
GFR calc Af Amer: 68 mL/min/{1.73_m2} (ref >59)
GFR calc non Af Amer: 59 mL/min/{1.73_m2} — ABNORMAL LOW (ref >59)
Globulin, Total: 3.6 g/dL (ref 1.5–4.5)
Glucose: 96 mg/dL (ref 65–99)
Potassium: 4.1 mmol/L (ref 3.5–5.2)
Sodium: 138 mmol/L (ref 134–144)
Total Protein: 7.6 g/dL (ref 6.0–8.5)

## 2019-10-11 ENCOUNTER — Other Ambulatory Visit: Payer: Self-pay | Admitting: Family Medicine

## 2019-10-11 DIAGNOSIS — Z1231 Encounter for screening mammogram for malignant neoplasm of breast: Secondary | ICD-10-CM

## 2020-03-07 ENCOUNTER — Encounter: Payer: Self-pay | Admitting: Family Medicine

## 2020-03-07 ENCOUNTER — Ambulatory Visit (INDEPENDENT_AMBULATORY_CARE_PROVIDER_SITE_OTHER): Payer: Medicare Other | Admitting: Family Medicine

## 2020-03-07 DIAGNOSIS — R7989 Other specified abnormal findings of blood chemistry: Secondary | ICD-10-CM

## 2020-03-07 DIAGNOSIS — E782 Mixed hyperlipidemia: Secondary | ICD-10-CM

## 2020-03-07 DIAGNOSIS — I10 Essential (primary) hypertension: Secondary | ICD-10-CM

## 2020-03-07 DIAGNOSIS — E785 Hyperlipidemia, unspecified: Secondary | ICD-10-CM | POA: Insufficient documentation

## 2020-03-07 NOTE — Progress Notes (Signed)
Virtual Visit via telephone Note  I connected with Beth Bryant on 03/07/20 at 1328 by telephone and verified that I am speaking with the correct person using two identifiers. Beth Bryant is currently located at home and no other people are currently with her during visit. The provider, Elige Radon Sumedh Shinsato, MD is located in their office at time of visit.  Call ended at 1333  I discussed the limitations, risks, security and privacy concerns of performing an evaluation and management service by telephone and the availability of in person appointments. I also discussed with the patient that there may be a patient responsible charge related to this service. The patient expressed understanding and agreed to proceed.   History and Present Illness: Hyperlipidemia Patient is coming in for recheck of his hyperlipidemia. The patient is currently taking fish oils. They deny any issues with myalgias or history of liver damage from it. They deny any focal numbness or weakness or chest pain.   Hypertension Patient is currently on losartan hctz, and their blood pressure today is 120-130 systolic. Patient denies any lightheadedness or dizziness. Patient denies headaches, blurred vision, chest pains, shortness of breath, or weakness. Denies any side effects from medication and is content with current medication.   No diagnosis found.  Outpatient Encounter Medications as of 03/07/2020  Medication Sig  . Ascorbic Acid (VITAMIN C) 100 MG tablet Take 100 mg by mouth daily.  Marland Kitchen losartan-hydrochlorothiazide (HYZAAR) 50-12.5 MG tablet Take 1 tablet by mouth daily.  . Multiple Vitamin (MULTIVITAMIN) capsule Take 1 capsule by mouth daily.  . Omega-3 Fatty Acids (FISH OIL) 1000 MG CAPS Take by mouth.  Marland Kitchen OVER THE COUNTER MEDICATION Complete multi - I daily   No facility-administered encounter medications on file as of 03/07/2020.    Review of Systems  Constitutional: Negative for chills and fever.    Respiratory: Negative for shortness of breath and wheezing.   Cardiovascular: Negative for chest pain and leg swelling.  Musculoskeletal: Negative for back pain and gait problem.  Skin: Negative for rash.  Neurological: Negative for dizziness, weakness and light-headedness.  All other systems reviewed and are negative.   Observations/Objective: Patient sounds comfortable and in no acute distress  Assessment and Plan: Problem List Items Addressed This Visit      Cardiovascular and Mediastinum   Essential hypertension - Primary     Other   Hyperlipidemia   Elevated liver function tests      Continue current medications and no changes  Patient declines labs today Follow up plan: Return in about 6 months (around 09/06/2020), or if symptoms worsen or fail to improve, for htn and hld.     I discussed the assessment and treatment plan with the patient. The patient was provided an opportunity to ask questions and all were answered. The patient agreed with the plan and demonstrated an understanding of the instructions.   The patient was advised to call back or seek an in-person evaluation if the symptoms worsen or if the condition fails to improve as anticipated.  The above assessment and management plan was discussed with the patient. The patient verbalized understanding of and has agreed to the management plan. Patient is aware to call the clinic if symptoms persist or worsen. Patient is aware when to return to the clinic for a follow-up visit. Patient educated on when it is appropriate to go to the emergency department.    I provided 5 minutes of non-face-to-face time during this encounter.    Ivin Booty  A Westen Dinino, MD

## 2020-04-04 ENCOUNTER — Ambulatory Visit (INDEPENDENT_AMBULATORY_CARE_PROVIDER_SITE_OTHER): Payer: Medicare Other

## 2020-04-04 DIAGNOSIS — Z0000 Encounter for general adult medical examination without abnormal findings: Secondary | ICD-10-CM

## 2020-04-04 NOTE — Progress Notes (Signed)
MEDICARE ANNUAL WELLNESS VISIT  04/04/2020  Telephone Visit Disclaimer This Medicare AWV was conducted by telephone due to national recommendations for restrictions regarding the COVID-19 Pandemic (e.g. social distancing).  I verified, using two identifiers, that I am speaking with Beth Bryant or their authorized healthcare agent. I discussed the limitations, risks, security, and privacy concerns of performing an evaluation and management service by telephone and the potential availability of an in-person appointment in the future. The patient expressed understanding and agreed to proceed.   Subjective:  Beth Bryant is a 74 y.o. female patient of Dettinger, Fransisca Kaufmann, MD who had a Medicare Annual Wellness Visit today via telephone. Martiza lives in nearby [place] with her husband. She is retired but has worked doing many things over the years. 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 She doesn't do any of that any more. She enjoys working in her flowers, watching and feeding the birds in their yard. Her and her husband occasionally take road trips but generally are close by and just for the day. They have 2 children, 1 daughter, and a handicapped son that passed away in 03/11/04. She is unable to do any type of regular exercise due to her back condition and chronic sciatic pain. Her and her husband have a living will. They are both very active. While reviewing her health maintenance she refused the Covid Vaccine and also a colonoscopy. She doesn't want either one. She has 2 grandchildren.   Patient Care Team: Dettinger, Fransisca Kaufmann, MD as PCP - General (Family Medicine) Felipa Furnace, [place] (Chiropractic Medicine)  Advanced Directives 04/04/2020 03/31/2019  Does Patient Have a Medical Advance  Directive? Yes Yes  Type of Advance Directive Living will [place]  Does patient want to make changes to medical advance directive? No - Patient declined -  Copy of [place] in Chart? - No - copy requested    Hospital Utilization Over the Past 12 Months: # of hospitalizations or ER visits: 0 # of surgeries: 1  Review of Systems    Patient reports that her overall health is worse compared to last year.  History obtained from chart review  Patient Reported Readings (BP, Pulse, CBG, Weight, etc) none  Pain Assessment Pain : 0-10 Pain Score: 5  Pain Type: Chronic pain Pain Location: Back Pain Orientation: Lower Pain Descriptors / Indicators: Constant Pain Onset: More than a month ago Pain Frequency: Constant Pain Relieving Factors: Medication and rest Effect of Pain on Daily Activities: Does slow patient down  Pain Relieving Factors: Medication and rest  Current Medications & Allergies (verified) Allergies as of 04/04/2020   No Known Allergies     Medication List       Accurate as of Apr 04, 2020  9:05 AM. If you have any questions, ask your nurse or doctor.        Fish Oil 1000 MG Caps Take by mouth.   losartan-hydrochlorothiazide 50-12.5 MG tablet Commonly known as: HYZAAR Take 1 tablet by mouth daily.   multivitamin capsule Take 1 capsule by mouth daily.   OVER THE COUNTER MEDICATION Complete multi - I daily   vitamin C 100 MG tablet Take 100 mg by mouth daily.       History (reviewed): Past Medical History:  Diagnosis  Date  . Arthritis   . DDD (degenerative disc disease), lumbar   . Hypertension   . Scoliosis    Past Surgical History:  Procedure Laterality Date  . hemorrhoid surg.     . TUBAL LIGATION     Family History  Problem Relation Age of Onset  . Stroke Mother   . Hypertension Mother   . Heart disease Father        MI at 45  . Arthritis Brother   . Heart disease Brother   . Epilepsy Son     . Cerebral palsy Son   . Learning disabilities Son    Social History   Socioeconomic History  . Marital status: Married    Spouse name: Ray  . Number of children: 2  . Years of education: Not on file  . Highest education level: High school graduate  Occupational History  . Occupation: Retired  Tobacco Use  . Smoking status: Never Smoker  . Smokeless tobacco: Never Used  Substance and Sexual Activity  . Alcohol use: No    Alcohol/week: 0.0 standard drinks  . Drug use: No  . Sexual activity: Yes    Birth control/protection: Surgical  Other Topics Concern  . Not on file  Social History Narrative  . Not on file   Social Determinants of Health   Financial Resource Strain:   . Difficulty of Paying Living Expenses:   Food Insecurity:   . Worried About Programme researcher, broadcasting/film/video in the Last Year:   . Barista in the Last Year:   Transportation Needs:   . Freight forwarder (Medical):   Marland Kitchen Lack of Transportation (Non-Medical):   Physical Activity:   . Days of Exercise per Week:   . Minutes of Exercise per Session:   Stress:   . Feeling of Stress :   Social Connections:   . Frequency of Communication with Friends and Family:   . Frequency of Social Gatherings with Friends and Family:   . Attends Religious Services:   . Active Member of Clubs or Organizations:   . Attends Banker Meetings:   Marland Kitchen Marital Status:     Activities of Daily Living In your present state of health, do you have any difficulty performing the following activities: 04/04/2020  Hearing? N  Vision? Y  Comment Wears reading glasses  Difficulty concentrating or making decisions? N  Walking or climbing stairs? Y  Comment At times due to back pain  Dressing or bathing? N  Doing errands, shopping? N  Preparing Food and eating ? N  Using the Toilet? N  In the past six months, have you accidently leaked urine? N  Do you have problems with loss of bowel control? N  Managing your  Medications? N  Managing your Finances? N  Housekeeping or managing your Housekeeping? N  Some recent data might be hidden    Patient Education/ Literacy How often do you need to have someone help you when you read instructions, pamphlets, or other written materials from your doctor or pharmacy?: 1 - Never What is the last grade level you completed in school?: 12th grade  Exercise Current Exercise Habits: The patient does not participate in regular exercise at present, Exercise limited by: orthopedic condition(s)  Diet Patient reports consuming 3 meals a day and 2 snack(s) a day Patient reports that her primary diet is: Regular Patient reports that she does have regular access to food.   Depression Screen PHQ 2/9 Scores  04/04/2020 09/07/2019 03/31/2019 09/01/2018 02/24/2018 07/29/2017 06/01/2017  PHQ - 2 Score 0 0 0 0 0 0 0     Fall Risk Fall Risk  04/04/2020 09/07/2019 03/31/2019 09/01/2018 02/24/2018  Falls in the past year? 0 0 0 No No     Objective:  Gearldean Lomanto seemed alert and oriented and she participated appropriately during our telephone visit.  Blood Pressure Weight BMI  BP Readings from Last 3 Encounters:  09/07/19 126/85  09/01/18 (!) 146/98  02/24/18 (!) 148/93   Wt Readings from Last 3 Encounters:  09/07/19 198 lb 9.6 oz (90.1 kg)  09/01/18 186 lb (84.4 kg)  02/24/18 185 lb (83.9 kg)   BMI Readings from Last 1 Encounters:  09/07/19 34.09 kg/m    *Unable to obtain current vital signs, weight, and BMI due to telephone visit type  Hearing/Vision  . Mozell did not seem to have difficulty with hearing/understanding during the telephone conversation . Reports that she has not had a formal eye exam by an eye care professional within the past year . Reports that she has not had a formal hearing evaluation within the past year *Unable to fully assess hearing and vision during telephone visit type  Cognitive Function: 6CIT Screen 04/04/2020 03/31/2019  What  Year? 0 points 0 points  What month? 0 points 0 points  What time? 0 points 0 points  Count back from 20 0 points 0 points  Months in reverse 0 points 0 points  Repeat phrase 0 points 0 points  Total Score 0 0   (Normal:0-7, Significant for Dysfunction: >8)  Normal Cognitive Function Screening: Yes   Immunization & Health Maintenance Record Immunization History  Administered Date(s) Administered  . Influenza-Unspecified 09/10/2018    Health Maintenance  Topic Date Due  . MAMMOGRAM  Never done  . DEXA SCAN  Never done  . COLONOSCOPY  04/04/2021 (Originally 02/27/1998)  . TETANUS/TDAP  04/04/2021 (Originally 02/28/1967)  . PNA vac Low Risk Adult (1 of 2 - PCV13) 04/04/2021 (Originally 02/27/2013)  . INFLUENZA VACCINE  06/10/2020  . Hepatitis C Screening  Completed  . COVID-19 Vaccine  Discontinued       Assessment  This is a routine wellness examination for [facility].  Health Maintenance: Due or Overdue Health Maintenance Due  Topic Date Due  . MAMMOGRAM  Never done  . DEXA SCAN  Never done    [name] does not need a referral for Community Assistance: Care Management:   no Social Work:    no Prescription Assistance:  no Nutrition/Diabetes Education:  no   Plan:  Personalized Goals Goals Addressed            This Visit's Progress   . DIET - EAT MORE FRUITS AND VEGETABLES      . Exercise 150 min/wk Moderate Activity        Personalized Health Maintenance & Screening Recommendations  Bone densitometry screening  Lung Cancer Screening Recommended: no (Low Dose CT Chest recommended if Age 60-80 years, 30 pack-year currently smoking OR have quit w/in past 15 years) Hepatitis C Screening recommended: Done 07-29-2017 HIV Screening recommended: no  Advanced Directives: Written information was not prepared per patient's request.  Referrals & Orders No orders of the defined types were placed in this encounter.   Follow-up Plan . Follow-up  with Dettinger, Elige Radon, MD as planned . Schedule for a bone density test    I have personally reviewed and noted the following in the patient's chart:   .  Medical and social history . Use of alcohol, tobacco or illicit drugs  . Current medications and supplements . Functional ability and status . Nutritional status . Physical activity . Advanced directives . List of other physicians . Hospitalizations, surgeries, and ER visits in previous 12 months . Vitals . Screenings to include cognitive, depression, and falls . Referrals and appointments  In addition, I have reviewed and discussed with [name] certain preventive protocols, quality metrics, and best practice recommendations. A written personalized care plan for preventive services as well as general preventive health recommendations is available and can be mailed to the patient at her request.      Cleda Daub LPN [id number]

## 2020-04-04 NOTE — Patient Instructions (Signed)
  Beth Bryant , Thank you for taking time to come for your Medicare Wellness Visit. I appreciate your ongoing commitment to your health goals. Please review the following plan we discussed and let me know if I can assist you in the future.   These are the goals we discussed: Goals    . DIET - EAT MORE FRUITS AND VEGETABLES    . Exercise 150 min/wk Moderate Activity    . Weight (lb) < 150 lb (68 kg)     Pt states she used to weigh 125 but she would really like to try and get to less than 150lbs       This is a list of the screening recommended for you and due dates:  Health Maintenance  Topic Date Due  . Mammogram  Never done  . DEXA scan (bone density measurement)  Never done  . Colon Cancer Screening  04/04/2021*  . Tetanus Vaccine  04/04/2021*  . Pneumonia vaccines (1 of 2 - PCV13) 04/04/2021*  . Flu Shot  06/10/2020  .  Hepatitis C: One time screening is recommended by Center for Disease Control  (CDC) for  adults born from [birth year] through 1963.   Completed  . COVID-19 Vaccine  Discontinued  *Topic was postponed. The date shown is not the original due date.

## 2020-09-07 ENCOUNTER — Other Ambulatory Visit: Payer: Self-pay

## 2020-09-07 ENCOUNTER — Ambulatory Visit: Payer: Medicare Other | Admitting: Family Medicine

## 2020-09-07 ENCOUNTER — Encounter: Payer: Self-pay | Admitting: Family Medicine

## 2020-09-07 VITALS — BP 148/91 | HR 100 | Temp 97.0°F | Ht 64.0 in | Wt 204.0 lb

## 2020-09-07 DIAGNOSIS — I10 Essential (primary) hypertension: Secondary | ICD-10-CM | POA: Diagnosis not present

## 2020-09-07 DIAGNOSIS — E782 Mixed hyperlipidemia: Secondary | ICD-10-CM

## 2020-09-07 MED ORDER — LOSARTAN POTASSIUM-HCTZ 50-12.5 MG PO TABS: 1.0000 | ORAL_TABLET | Freq: Every day | ORAL | 3 refills | Status: DC

## 2020-09-07 NOTE — Progress Notes (Signed)
BP (!) 148/91   Pulse 100   Temp (!) 97 F (36.1 C)   Ht 5' 4" (1.626 m)   Wt 204 lb (92.5 kg)   SpO2 95%   BMI 35.02 kg/m    Subjective:   Patient ID: Beth Bryant, female    DOB: Jan 06, 1946, 74 y.o.   MRN: [id number]  HPI: Beth Bryant is a 74 y.o. female presenting on 09/07/2020 for Medical Management of Chronic Issues and Hypertension   HPI Hypertension Patient is currently on losartan-hctz, I think and their blood pressure today is 148/91. Patient denies any lightheadedness or dizziness. Patient denies headaches, blurred vision, chest pains, shortness of breath, or weakness. Denies any side effects from medication and is content with current medication.   Hyperlipidemia Patient is coming in for recheck of his hyperlipidemia. The patient is currently taking fish oil. They deny any issues with myalgias or history of liver damage from it. They deny any focal numbness or weakness or chest pain.   Relevant past medical, surgical, family and social history reviewed and updated as indicated. Interim medical history since our last visit reviewed. Allergies and medications reviewed and updated.  Review of Systems  Constitutional: Negative for chills and fever.  HENT: Negative for congestion, ear discharge and ear pain.   Eyes: Negative for redness and visual disturbance.  Respiratory: Negative for chest tightness and shortness of breath.   Cardiovascular: Negative for chest pain and leg swelling.  Genitourinary: Negative for difficulty urinating and dysuria.  Musculoskeletal: Negative for back pain and gait problem.  Skin: Negative for rash.  Neurological: Negative for light-headedness and headaches.  Psychiatric/Behavioral: Negative for agitation and behavioral problems.  All other systems reviewed and are negative.   Per HPI unless specifically indicated above   Allergies as of 09/07/2020   No Known Allergies     Medication List       Accurate as of  September 07, 2020  1:39 PM. If you have any questions, ask your nurse or doctor.        Fish Oil 1000 MG Caps Take by mouth.   losartan-hydrochlorothiazide 50-12.5 MG tablet Commonly known as: HYZAAR Take 1 tablet by mouth daily.   multivitamin capsule Take 1 capsule by mouth daily.   multivitamin-lutein Caps capsule Take 1 capsule by mouth daily.   OVER THE COUNTER MEDICATION Omega 7   PROBIOTIC-10 PO Take by mouth daily.   vitamin C 100 MG tablet Take 100 mg by mouth daily.        Objective:   BP (!) 148/91   Pulse 100   Temp (!) 97 F (36.1 C)   Ht 5' 4" (1.626 m)   Wt 204 lb (92.5 kg)   SpO2 95%   BMI 35.02 kg/m   Wt Readings from Last 3 Encounters:  09/07/20 204 lb (92.5 kg)  09/07/19 198 lb 9.6 oz (90.1 kg)  09/01/18 186 lb (84.4 kg)    Physical Exam Vitals and nursing note reviewed.  Constitutional:      General: She is not in acute distress.    Appearance: She is well-developed. She is not diaphoretic.  Eyes:     Conjunctiva/sclera: Conjunctivae normal.  Cardiovascular:     Rate and Rhythm: Normal rate and regular rhythm.     Heart sounds: Normal heart sounds. No murmur heard.   Pulmonary:     Effort: Pulmonary effort is normal. No respiratory distress.     Breath sounds: Normal breath sounds. No wheezing.  Musculoskeletal:        General: No tenderness. Normal range of motion.  Skin:    General: Skin is warm and dry.     Findings: No rash.  Neurological:     Mental Status: She is alert and oriented to person, place, and time.     Coordination: Coordination normal.  Psychiatric:        Behavior: Behavior normal.       Assessment & Plan:   Problem List Items Addressed This Visit      Cardiovascular and Mediastinum   Essential hypertension   Relevant Medications   losartan-hydrochlorothiazide (HYZAAR) 50-12.5 MG tablet   Other Relevant Orders   CBC with Differential/Platelet   CMP14+EGFR     Other   Hyperlipidemia - Primary     Relevant Medications   losartan-hydrochlorothiazide (HYZAAR) 50-12.5 MG tablet   Other Relevant Orders   Lipid panel      Continue current blood pressure medication seems to be doing well.  No change in medication.  We will check blood work today. Follow up plan: Return in about 6 months (around 03/08/2021), or if symptoms worsen or fail to improve, for hypertension hyperlipidemia.  Counseling provided for all of the vaccine components Orders Placed This Encounter  Procedures  . CBC with Differential/Platelet  . CMP14+EGFR  . Lipid panel    Caryl Pina, MD [facility] 09/07/2020, 1:39 PM

## 2020-09-08 LAB — CMP14+EGFR
ALT: 61 IU/L — ABNORMAL HIGH (ref 0–32)
AST: 57 IU/L — ABNORMAL HIGH (ref 0–40)
Albumin/Globulin Ratio: 1.3 (ref 1.2–2.2)
Albumin: 4.3 g/dL (ref 3.7–4.7)
Alkaline Phosphatase: 71 IU/L (ref 44–121)
BUN/Creatinine Ratio: 18 (ref 12–28)
BUN: 18 mg/dL (ref 8–27)
Bilirubin Total: 0.4 mg/dL (ref 0.0–1.2)
CO2: 24 mmol/L (ref 20–29)
Calcium: 9.9 mg/dL (ref 8.7–10.3)
Chloride: 99 mmol/L (ref 96–106)
Creatinine, Ser: 0.99 mg/dL (ref 0.57–1.00)
GFR calc Af Amer: 66 mL/min/{1.73_m2} (ref >59)
GFR calc non Af Amer: 57 mL/min/{1.73_m2} — ABNORMAL LOW (ref >59)
Globulin, Total: 3.3 g/dL (ref 1.5–4.5)
Glucose: 91 mg/dL (ref 65–99)
Potassium: 4.4 mmol/L (ref 3.5–5.2)
Sodium: 138 mmol/L (ref 134–144)
Total Protein: 7.6 g/dL (ref 6.0–8.5)

## 2020-09-08 LAB — CBC WITH DIFFERENTIAL/PLATELET
Basophils Absolute: 0 10*3/uL (ref 0.0–0.2)
Basos: 0 %
EOS (ABSOLUTE): 0.2 10*3/uL (ref 0.0–0.4)
Eos: 2 %
Hematocrit: 42.3 % (ref 34.0–46.6)
Hemoglobin: 14.6 g/dL (ref 11.1–15.9)
Immature Grans (Abs): 0 10*3/uL (ref 0.0–0.1)
Immature Granulocytes: 0 %
Lymphocytes Absolute: 2.4 10*3/uL (ref 0.7–3.1)
Lymphs: 34 %
MCH: 32.5 pg (ref 26.6–33.0)
MCHC: 34.5 g/dL (ref 31.5–35.7)
MCV: 94 fL (ref 79–97)
Monocytes Absolute: 0.7 10*3/uL (ref 0.1–0.9)
Monocytes: 10 %
Neutrophils Absolute: 3.7 10*3/uL (ref 1.4–7.0)
Neutrophils: 54 %
Platelets: 174 10*3/uL (ref 150–450)
RBC: 4.49 x10E6/uL (ref 3.77–5.28)
RDW: 12.4 % (ref 11.7–15.4)
WBC: 7 10*3/uL (ref 3.4–10.8)

## 2020-09-08 LAB — LIPID PANEL
Chol/HDL Ratio: 2.7 ratio (ref 0.0–4.4)
Cholesterol, Total: 208 mg/dL — ABNORMAL HIGH (ref 100–199)
HDL: 76 mg/dL (ref >39)
LDL Chol Calc (NIH): 111 mg/dL — ABNORMAL HIGH (ref 0–99)
Triglycerides: 119 mg/dL (ref 0–149)
VLDL Cholesterol Cal: 21 mg/dL (ref 5–40)

## 2021-03-07 ENCOUNTER — Other Ambulatory Visit: Payer: Self-pay

## 2021-03-07 ENCOUNTER — Encounter: Payer: Self-pay | Admitting: Family Medicine

## 2021-03-07 ENCOUNTER — Ambulatory Visit (INDEPENDENT_AMBULATORY_CARE_PROVIDER_SITE_OTHER): Payer: Medicare Other | Admitting: Family Medicine

## 2021-03-07 VITALS — BP 153/92 | HR 101 | Ht 64.0 in | Wt 204.0 lb

## 2021-03-07 DIAGNOSIS — E782 Mixed hyperlipidemia: Secondary | ICD-10-CM

## 2021-03-07 DIAGNOSIS — I10 Essential (primary) hypertension: Secondary | ICD-10-CM | POA: Diagnosis not present

## 2021-03-07 NOTE — Progress Notes (Signed)
BP (!) 153/92   Pulse (!) 101   Ht $R'5\' 4"'gH$  (1.626 m)   Wt 204 lb (92.5 kg)   SpO2 95%   BMI 35.02 kg/m    Subjective:   Patient ID: Beth Bryant, female    DOB: 11-07-1946, 75 y.o.   MRN: [id number]  HPI: Beth Bryant is a 75 y.o. female presenting on 03/07/2021 for Medical Management of Chronic Issues and Hypertension   HPI Hypertension Patient is currently on losartan hydrochlorothiazide, and their blood pressure today is 150/94 on recheck 130/90. Patient denies any lightheadedness or dizziness. Patient denies headaches, blurred vision, chest pains, shortness of breath, or weakness. Denies any side effects from medication and is content with current medication.   Hyperlipidemia Patient is coming in for recheck of his hyperlipidemia. The patient is currently taking fish oil. They deny any issues with myalgias or history of liver damage from it. They deny any focal numbness or weakness or chest pain.   Relevant past medical, surgical, family and social history reviewed and updated as indicated. Interim medical history since our last visit reviewed. Allergies and medications reviewed and updated.  Review of Systems  Constitutional: Negative for chills and fever.  Eyes: Negative for visual disturbance.  Respiratory: Negative for chest tightness and shortness of breath.   Cardiovascular: Negative for chest pain and leg swelling.  Musculoskeletal: Negative for back pain and gait problem.  Skin: Negative for rash.  Neurological: Negative for light-headedness and headaches.  Psychiatric/Behavioral: Negative for agitation and behavioral problems.  All other systems reviewed and are negative.   Per HPI unless specifically indicated above   Allergies as of 03/07/2021   No Known Allergies     Medication List       Accurate as of March 07, 2021  1:40 PM. If you have any questions, ask your nurse or doctor.        STOP taking these medications   OVER THE COUNTER  MEDICATION Stopped by: Fransisca Kaufmann Lateia Fraser, MD     TAKE these medications   Fish Oil 1000 MG Caps Take by mouth.   losartan-hydrochlorothiazide 50-12.5 MG tablet Commonly known as: HYZAAR Take 1 tablet by mouth daily.   multivitamin capsule Take 1 capsule by mouth daily.   multivitamin-lutein Caps capsule Take 1 capsule by mouth daily.   PROBIOTIC-10 PO Take by mouth daily.   vitamin C 100 MG tablet Take 100 mg by mouth daily.        Objective:   BP (!) 153/92   Pulse (!) 101   Ht $R'5\' 4"'lf$  (1.626 m)   Wt 204 lb (92.5 kg)   SpO2 95%   BMI 35.02 kg/m   Wt Readings from Last 3 Encounters:  03/07/21 204 lb (92.5 kg)  09/07/20 204 lb (92.5 kg)  09/07/19 198 lb 9.6 oz (90.1 kg)    Physical Exam Vitals and nursing note reviewed.  Constitutional:      General: She is not in acute distress.    Appearance: She is well-developed. She is not diaphoretic.  Eyes:     Conjunctiva/sclera: Conjunctivae normal.     Pupils: Pupils are equal, round, and reactive to light.  Cardiovascular:     Rate and Rhythm: Normal rate and regular rhythm.     Heart sounds: Normal heart sounds. No murmur heard.   Pulmonary:     Effort: Pulmonary effort is normal. No respiratory distress.     Breath sounds: Normal breath sounds. No wheezing.  Musculoskeletal:  General: No tenderness. Normal range of motion.  Skin:    General: Skin is warm and dry.     Findings: No rash.  Neurological:     Mental Status: She is alert and oriented to person, place, and time.     Coordination: Coordination normal.  Psychiatric:        Behavior: Behavior normal.       Assessment & Plan:   Problem List Items Addressed This Visit      Cardiovascular and Mediastinum   Essential hypertension   Relevant Orders   CBC with Differential/Platelet   CMP14+EGFR   Lipid panel     Other   Hyperlipidemia - Primary   Relevant Orders   CBC with Differential/Platelet   CMP14+EGFR   Lipid panel       Patient declines all vaccines and mammograms and DEXA scans and colonoscopy Follow up plan: Return in about 6 months (around 09/06/2021), or if symptoms worsen or fail to improve, for Hypertension hyperlipidemia.  Counseling provided for all of the vaccine components Orders Placed This Encounter  Procedures  . CBC with Differential/Platelet  . CMP14+EGFR  . Lipid panel    Caryl Pina, MD [facility] 03/07/2021, 1:40 PM

## 2021-03-08 LAB — CBC WITH DIFFERENTIAL/PLATELET
Basophils Absolute: 0 10*3/uL (ref 0.0–0.2)
Basos: 0 %
EOS (ABSOLUTE): 0.1 10*3/uL (ref 0.0–0.4)
Eos: 2 %
Hematocrit: 40.2 % (ref 34.0–46.6)
Hemoglobin: 14 g/dL (ref 11.1–15.9)
Immature Grans (Abs): 0 10*3/uL (ref 0.0–0.1)
Immature Granulocytes: 0 %
Lymphocytes Absolute: 2.4 10*3/uL (ref 0.7–3.1)
Lymphs: 34 %
MCH: 32.3 pg (ref 26.6–33.0)
MCHC: 34.8 g/dL (ref 31.5–35.7)
MCV: 93 fL (ref 79–97)
Monocytes Absolute: 0.7 10*3/uL (ref 0.1–0.9)
Monocytes: 10 %
Neutrophils Absolute: 3.8 10*3/uL (ref 1.4–7.0)
Neutrophils: 54 %
Platelets: 179 10*3/uL (ref 150–450)
RBC: 4.34 x10E6/uL (ref 3.77–5.28)
RDW: 12.6 % (ref 11.7–15.4)
WBC: 7.1 10*3/uL (ref 3.4–10.8)

## 2021-03-08 LAB — CMP14+EGFR
ALT: 78 IU/L — ABNORMAL HIGH (ref 0–32)
AST: 79 IU/L — ABNORMAL HIGH (ref 0–40)
Albumin/Globulin Ratio: 1.4 (ref 1.2–2.2)
Albumin: 4.5 g/dL (ref 3.7–4.7)
Alkaline Phosphatase: 69 IU/L (ref 44–121)
BUN/Creatinine Ratio: 13 (ref 12–28)
BUN: 11 mg/dL (ref 8–27)
Bilirubin Total: 0.5 mg/dL (ref 0.0–1.2)
CO2: 26 mmol/L (ref 20–29)
Calcium: 9.8 mg/dL (ref 8.7–10.3)
Chloride: 95 mmol/L — ABNORMAL LOW (ref 96–106)
Creatinine, Ser: 0.84 mg/dL (ref 0.57–1.00)
Globulin, Total: 3.3 g/dL (ref 1.5–4.5)
Glucose: 97 mg/dL (ref 65–99)
Potassium: 4.3 mmol/L (ref 3.5–5.2)
Sodium: 134 mmol/L (ref 134–144)
Total Protein: 7.8 g/dL (ref 6.0–8.5)
eGFR: 73 mL/min/{1.73_m2} (ref >59)

## 2021-03-08 LAB — LIPID PANEL
Chol/HDL Ratio: 2.5 ratio (ref 0.0–4.4)
Cholesterol, Total: 210 mg/dL — ABNORMAL HIGH (ref 100–199)
HDL: 84 mg/dL (ref >39)
LDL Chol Calc (NIH): 108 mg/dL — ABNORMAL HIGH (ref 0–99)
Triglycerides: 104 mg/dL (ref 0–149)
VLDL Cholesterol Cal: 18 mg/dL (ref 5–40)

## 2021-04-05 ENCOUNTER — Ambulatory Visit (INDEPENDENT_AMBULATORY_CARE_PROVIDER_SITE_OTHER): Payer: Medicare Other

## 2021-04-05 VITALS — Ht 64.0 in | Wt 200.0 lb

## 2021-04-05 DIAGNOSIS — Z0000 Encounter for general adult medical examination without abnormal findings: Secondary | ICD-10-CM | POA: Diagnosis not present

## 2021-04-05 NOTE — Progress Notes (Signed)
Subjective:   Beth Bryant is a 75 y.o. female who presents for Medicare Annual (Subsequent) preventive examination.  Virtual Visit via Telephone Note  I connected with  Beth Bryant on 04/05/21 at  8:15 AM EDT by telephone and verified that I am speaking with the correct person using two identifiers.  Location: Patient: Home Provider: [facility] Persons participating in the virtual visit: patient/Nurse Health Advisor   I discussed the limitations, risks, security and privacy concerns of performing an evaluation and management service by telephone and the availability of in person appointments. The patient expressed understanding and agreed to proceed.  Interactive audio and video telecommunications were attempted between this nurse and patient, however failed, due to patient having technical difficulties OR patient did not have access to video capability.  We continued and completed visit with audio only.  Some vital signs may be absent or patient reported.   Beth Bryant E Beth Bilger, LPN   Review of Systems     Cardiac Risk Factors include: advanced age (>42men, >11 women);obesity (BMI >30kg/m2);hypertension;sedentary lifestyle;dyslipidemia     Objective:    Today's Vitals   04/05/21 0813  Weight: 200 lb (90.7 kg)  Height: 5\' 4"  (1.626 m)  PainSc: 3    Body mass index is 34.33 kg/m.  Advanced Directives 04/05/2021 04/04/2020 03/31/2019  Does Patient Have a Medical Advance Directive? Yes Yes Yes  Type of 04/02/2019 of [place];Living will Living will Healthcare Power of Attorney  Does patient want to make changes to medical advance directive? - No - Patient declined -  Copy of Healthcare Power of Attorney in Chart? No - copy requested - No - copy requested    Current Medications (verified) Outpatient Encounter Medications as of 04/05/2021  Medication Sig  . Ascorbic Acid (VITAMIN C) 100 MG tablet Take 100 mg by mouth daily.  04/07/2021  losartan-hydrochlorothiazide (HYZAAR) 50-12.5 MG tablet Take 1 tablet by mouth daily.  . Multiple Vitamin (MULTIVITAMIN) capsule Take 1 capsule by mouth daily.  . multivitamin-lutein (OCUVITE-LUTEIN) CAPS capsule Take 1 capsule by mouth daily.  . Omega-3 Fatty Acids (FISH OIL) 1000 MG CAPS Take by mouth.  . Probiotic Product (PROBIOTIC-10 PO) Take by mouth daily.   No facility-administered encounter medications on file as of 04/05/2021.    Allergies (verified) Patient has no known allergies.   History: Past Medical History:  Diagnosis Date  . Arthritis   . DDD (degenerative disc disease), lumbar   . Hypertension   . Scoliosis    Past Surgical History:  Procedure Laterality Date  . hemorrhoid surg.     . TUBAL LIGATION     Family History  Problem Relation Age of Onset  . Stroke Mother   . Hypertension Mother   . Heart disease Father        MI at 76  . Arthritis Brother   . Heart disease Brother   . Epilepsy Son   . Cerebral palsy Son   . Learning disabilities Son    Social History   Socioeconomic History  . Marital status: Married    Spouse name: Beth Bryant  . Number of children: 2  . Years of education: Not on file  . Highest education level: High school graduate  Occupational History  . Occupation: Retired  Tobacco Use  . Smoking status: Never Smoker  . Smokeless tobacco: Never Used  Vaping Use  . Vaping Use: Never used  Substance and Sexual Activity  . Alcohol use: No    Alcohol/week: 0.0 standard  drinks  . Drug use: No  . Sexual activity: Yes    Birth control/protection: Surgical  Other Topics Concern  . Not on file  Social History Narrative   Lives home with husband. One daughter who lives in [place]   Social Determinants of Health   Financial Resource Strain: Low Risk   . Difficulty of Paying Living Expenses: Not hard at all  Food Insecurity: No Food Insecurity  . Worried About Programme researcher, broadcasting/film/videounning Out of Food in the Last Year: Never true  . Ran Out of Food in  the Last Year: Never true  Transportation Needs: No Transportation Needs  . Lack of Transportation (Medical): No  . Lack of Transportation (Non-Medical): No  Physical Activity: Insufficiently Active  . Days of Exercise per Week: 4 days  . Minutes of Exercise per Session: 20 min  Stress: No Stress Concern Present  . Feeling of Stress : Not at all  Social Connections: Moderately Isolated  . Frequency of Communication with Friends and Family: More than three times a week  . Frequency of Social Gatherings with Friends and Family: Twice a week  . Attends Religious Services: Never  . Active Member of Clubs or Organizations: No  . Attends BankerClub or Organization Meetings: Never  . Marital Status: Married    Tobacco Counseling Counseling given: Not Answered   Clinical Intake:  Pre-visit preparation completed: Yes  Pain : 0-10 Pain Score: 3  Pain Type: Chronic pain Pain Location: Back Pain Descriptors / Indicators: Aching Pain Onset: More than a month ago Pain Frequency: Constant     BMI - recorded: 34.33 Nutritional Status: BMI > 30  Obese Nutritional Risks: None Diabetes: No  How often do you need to have someone help you when you read instructions, pamphlets, or other written materials from your doctor or pharmacy?: 1 - Never  Diabetic? No  Interpreter Needed?: No  Information entered by :: Zarielle Cea, LPN   Activities of Daily Living In your present state of health, do you have any difficulty performing the following activities: 04/05/2021  Hearing? N  Vision? N  Difficulty concentrating or making decisions? N  Walking or climbing stairs? N  Dressing or bathing? N  Doing errands, shopping? N  Preparing Food and eating ? N  Using the Toilet? N  In the past six months, have you accidently leaked urine? N  Do you have problems with loss of bowel control? N  Managing your Medications? N  Managing your Finances? N  Housekeeping or managing your Housekeeping? N   Some recent data might be hidden    Patient Care Team: Dettinger, Elige RadonJoshua A, MD as PCP - General (Family Medicine) Duffy Rhodyury, Robert S, DC (Chiropractic Medicine) Moody BruinsLineberry, Keith H as Physician Assistant (Chiropractic Medicine)  Indicate any recent Medical Services you may have received from other than [facility] providers in the past year (date may be approximate).     Assessment:   This is a routine wellness examination for [place].  Hearing/Vision screen  Hearing Screening   125Hz  250Hz  500Hz  1000Hz  2000Hz  3000Hz  4000Hz  6000Hz  8000Hz   Right ear:           Left ear:           Comments: Denies hearing difficulties   Vision Screening Comments: Denies vision difficulties. Doesn't see eye doctor routinely - will see Dr Hyacinth MeekerMiller in [place] prn.  Dietary issues and exercise activities discussed: Current Exercise Habits: Home exercise routine, Type of exercise: walking, Time (Minutes): 20, Frequency (Times/Week): 4, Weekly  Exercise (Minutes/Week): 80, Intensity: Mild, Exercise limited by: orthopedic condition(s)  Goals Addressed            This Visit's Progress   . DIET - EAT MORE FRUITS AND VEGETABLES   On track     Depression Screen PHQ 2/9 Scores 04/05/2021 03/07/2021 09/07/2020 04/04/2020 09/07/2019 03/31/2019 09/01/2018  PHQ - 2 Score 0 0 0 0 0 0 0    Fall Risk Fall Risk  04/05/2021 03/07/2021 09/07/2020 04/04/2020 09/07/2019  Falls in the past year? 0 0 0 0 0  Number falls in past yr: 0 - - - -  Injury with Fall? 0 - - - -  Risk for fall due to : Orthopedic patient - - - -  Follow up Falls prevention discussed - - - -    FALL RISK PREVENTION PERTAINING TO THE HOME:  Any stairs in or around the home? No  If so, are there any without handrails? No  Home free of loose throw rugs in walkways, pet beds, electrical cords, etc? Yes  Adequate lighting in your home to reduce risk of falls? Yes   ASSISTIVE DEVICES UTILIZED TO PREVENT FALLS:  Life alert? No  Use of a cane, walker  or w/c? No  Grab bars in the bathroom? No  Shower chair or bench in shower? Yes  Elevated toilet seat or a handicapped toilet? No   TIMED UP AND GO:  Was the test performed? No . Telephonic visit  Cognitive Function:     6CIT Screen 04/05/2021 04/04/2020 03/31/2019  What Year? 0 points 0 points 0 points  What month? 0 points 0 points 0 points  What time? 0 points 0 points 0 points  Count back from 20 0 points 0 points 0 points  Months in reverse 0 points 0 points 0 points  Repeat phrase 0 points 0 points 0 points  Total Score 0 0 0    Immunizations Immunization History  Administered Date(s) Administered  . Influenza-Unspecified 09/10/2018    TDAP status: Due, Education has been provided regarding the importance of this vaccine. Advised may receive this vaccine at local pharmacy or Health Dept. Aware to provide a copy of the vaccination record if obtained from local pharmacy or Health Dept. Verbalized acceptance and understanding.  Flu Vaccine status: Declined, Education has been provided regarding the importance of this vaccine but patient still declined. Advised may receive this vaccine at local pharmacy or Health Dept. Aware to provide a copy of the vaccination record if obtained from local pharmacy or Health Dept. Verbalized acceptance and understanding.  Pneumococcal vaccine status: Declined,  Education has been provided regarding the importance of this vaccine but patient still declined. Advised may receive this vaccine at local pharmacy or Health Dept. Aware to provide a copy of the vaccination record if obtained from local pharmacy or Health Dept. Verbalized acceptance and understanding.   Covid-19 vaccine status: Declined, Education has been provided regarding the importance of this vaccine but patient still declined. Advised may receive this vaccine at local pharmacy or Health Dept.or vaccine clinic. Aware to provide a copy of the vaccination record if obtained from local  pharmacy or Health Dept. Verbalized acceptance and understanding.  Qualifies for Shingles Vaccine? Yes   Zostavax completed No   Shingrix Completed?: No.    Education has been provided regarding the importance of this vaccine. Patient has been advised to call insurance company to determine out of pocket expense if they have not yet received this vaccine. Advised may  also receive vaccine at local pharmacy or Health Dept. Verbalized acceptance and understanding.  Screening Tests Health Maintenance  Topic Date Due  . Zoster Vaccines- Shingrix (1 of 2) Never done  . MAMMOGRAM  09/07/2021 (Originally 02/27/1998)  . DEXA SCAN  03/07/2022 (Originally 02/27/2013)  . COLONOSCOPY (Pts 45-49yrs Insurance coverage will need to be confirmed)  03/07/2022 (Originally 02/27/1993)  . TETANUS/TDAP  03/07/2022 (Originally 02/28/1967)  . PNA vac Low Risk Adult (1 of 2 - PCV13) 03/07/2022 (Originally 02/27/2013)  . INFLUENZA VACCINE  06/10/2021  . Hepatitis C Screening  Completed  . HPV VACCINES  Aged Out  . COVID-19 Vaccine  Discontinued    Health Maintenance  Health Maintenance Due  Topic Date Due  . Zoster Vaccines- Shingrix (1 of 2) Never done    Colorectal cancer screening: No longer required. she refuses  Mammogram status: No longer required due to patient refuses.  Bone Density Screening: declined  Lung Cancer Screening: (Low Dose CT Chest recommended if Age 59-80 years, 30 pack-year currently smoking OR have quit w/in 15years.) does not qualify.   Additional Screening:  Hepatitis C Screening: does qualify; Completed 07/29/2017  Vision Screening: Recommended annual ophthalmology exams for early detection of glaucoma and other disorders of the eye. Is the patient up to date with their annual eye exam?  No  Who is the provider or what is the name of the office in which the patient attends annual eye exams? [facility] If pt is not established with a provider, would they like to be referred to a  provider to establish care? No .   Dental Screening: Recommended annual dental exams for proper oral hygiene  Community Resource Referral / Chronic Care Management: CRR required this visit?  No   CCM required this visit?  No      Plan:     I have personally reviewed and noted the following in the patient's chart:   . Medical and social history . Use of alcohol, tobacco or illicit drugs  . Current medications and supplements including opioid prescriptions.  . Functional ability and status . Nutritional status . Physical activity . Advanced directives . List of other physicians . Hospitalizations, surgeries, and ER visits in previous 12 months . Vitals . Screenings to include cognitive, depression, and falls . Referrals and appointments  In addition, I have reviewed and discussed with patient certain preventive protocols, quality metrics, and best practice recommendations. A written personalized care plan for preventive services as well as general preventive health recommendations were provided to patient.     [place] Constable, LPN   06/23/4817   Nurse Notes: Patient declines all vaccines and screenings.

## 2021-04-05 NOTE — Patient Instructions (Signed)
[name] , Thank you for taking time to come for your Medicare Wellness Visit. I appreciate your ongoing commitment to your health goals. Please review the following plan we discussed and let me know if I can assist you in the future.   Screening recommendations/referrals: Colonoscopy: Declined Mammogram: Declined Bone Density: Declined Recommended yearly ophthalmology/optometry visit for glaucoma screening and checkup Recommended yearly dental visit for hygiene and checkup  Vaccinations: Influenza vaccine: Declined Pneumococcal vaccine: Declined Tdap vaccine: Declined Shingles vaccine: Declined   Covid-19:Declined  Advanced directives: Please bring a copy of your health care power of attorney and living will to the office to be added to your chart at your convenience.  Conditions/risks identified: Aim for 30 minutes of exercise or brisk walking each day, drink 6-8 glasses of water and eat lots of fruits and vegetables.  Next appointment: Follow up in one year for your annual wellness visit    Preventive Care 65 Years and Older, Female Preventive care refers to lifestyle choices and visits with your health care provider that can promote health and wellness. What does preventive care include?  A yearly physical exam. This is also called an annual well check.  Dental exams once or twice a year.  Routine eye exams. Ask your health care provider how often you should have your eyes checked.  Personal lifestyle choices, including:  Daily care of your teeth and gums.  Regular physical activity.  Eating a healthy diet.  Avoiding tobacco and drug use.  Limiting alcohol use.  Practicing safe sex.  Taking low-dose aspirin every day.  Taking vitamin and mineral supplements as recommended by your health care provider. What happens during an annual well check? The services and screenings done by your health care provider during your annual well check will depend on your age,  overall health, lifestyle risk factors, and family history of disease. Counseling  Your health care provider may ask you questions about your:  Alcohol use.  Tobacco use.  Drug use.  Emotional well-being.  Home and relationship well-being.  Sexual activity.  Eating habits.  History of falls.  Memory and ability to understand (cognition).  Work and work Astronomer.  Reproductive health. Screening  You may have the following tests or measurements:  Height, weight, and BMI.  Blood pressure.  Lipid and cholesterol levels. These may be checked every 5 years, or more frequently if you are over 85 years old.  Skin check.  Lung cancer screening. You may have this screening every year starting at age 10 if you have a 30-pack-year history of smoking and currently smoke or have quit within the past 15 years.  Fecal occult blood test (FOBT) of the stool. You may have this test every year starting at age 81.  Flexible sigmoidoscopy or colonoscopy. You may have a sigmoidoscopy every 5 years or a colonoscopy every 10 years starting at age 61.  Hepatitis C blood test.  Hepatitis B blood test.  Sexually transmitted disease (STD) testing.  Diabetes screening. This is done by checking your blood sugar (glucose) after you have not eaten for a while (fasting). You may have this done every 1-3 years.  Bone density scan. This is done to screen for osteoporosis. You may have this done starting at age 51.  Mammogram. This may be done every 1-2 years. Talk to your health care provider about how often you should have regular mammograms. Talk with your health care provider about your test results, treatment options, and if necessary, the need  for more tests. Vaccines  Your health care provider may recommend certain vaccines, such as:  Influenza vaccine. This is recommended every year.  Tetanus, diphtheria, and acellular pertussis (Tdap, Td) vaccine. You may need a Td booster every 10  years.  Zoster vaccine. You may need this after age 31.  Pneumococcal 13-valent conjugate (PCV13) vaccine. One dose is recommended after age 84.  Pneumococcal polysaccharide (PPSV23) vaccine. One dose is recommended after age 53. Talk to your health care provider about which screenings and vaccines you need and how often you need them. This information is not intended to replace advice given to you by your health care provider. Make sure you discuss any questions you have with your health care provider. Document Released: 11/23/2015 Document Revised: 07/16/2016 Document Reviewed: 08/28/2015 Elsevier Interactive Patient Education  2017 [place] Prevention in the Home Falls can cause injuries. They can happen to people of all ages. There are many things you can do to make your home safe and to help prevent falls. What can I do on the outside of my home?  Regularly fix the edges of walkways and driveways and fix any cracks.  Remove anything that might make you trip as you walk through a door, such as a raised step or threshold.  Trim any bushes or trees on the path to your home.  Use bright outdoor lighting.  Clear any walking paths of anything that might make someone trip, such as rocks or tools.  Regularly check to see if handrails are loose or broken. Make sure that both sides of any steps have handrails.  Any raised decks and porches should have guardrails on the edges.  Have any leaves, snow, or ice cleared regularly.  Use sand or salt on walking paths during winter.  Clean up any spills in your garage right away. This includes oil or grease spills. What can I do in the bathroom?  Use night lights.  Install grab bars by the toilet and in the tub and shower. Do not use towel bars as grab bars.  Use non-skid mats or decals in the tub or shower.  If you need to sit down in the shower, use a plastic, non-slip stool.  Keep the floor dry. Clean up any water that  spills on the floor as soon as it happens.  Remove soap buildup in the tub or shower regularly.  Attach bath mats securely with double-sided non-slip rug tape.  Do not have throw rugs and other things on the floor that can make you trip. What can I do in the bedroom?  Use night lights.  Make sure that you have a light by your bed that is easy to reach.  Do not use any sheets or blankets that are too big for your bed. They should not hang down onto the floor.  Have a firm chair that has side arms. You can use this for support while you get dressed.  Do not have throw rugs and other things on the floor that can make you trip. What can I do in the kitchen?  Clean up any spills right away.  Avoid walking on wet floors.  Keep items that you use a lot in easy-to-reach places.  If you need to reach something above you, use a strong step stool that has a grab bar.  Keep electrical cords out of the way.  Do not use floor polish or wax that makes floors slippery. If you must use wax, use  non-skid floor wax.  Do not have throw rugs and other things on the floor that can make you trip. What can I do with my stairs?  Do not leave any items on the stairs.  Make sure that there are handrails on both sides of the stairs and use them. Fix handrails that are broken or loose. Make sure that handrails are as long as the stairways.  Check any carpeting to make sure that it is firmly attached to the stairs. Fix any carpet that is loose or worn.  Avoid having throw rugs at the top or bottom of the stairs. If you do have throw rugs, attach them to the floor with carpet tape.  Make sure that you have a light switch at the top of the stairs and the bottom of the stairs. If you do not have them, ask someone to add them for you. What else can I do to help prevent falls?  Wear shoes that:  Do not have high heels.  Have rubber bottoms.  Are comfortable and fit you well.  Are closed at the  toe. Do not wear sandals.  If you use a stepladder:  Make sure that it is fully opened. Do not climb a closed stepladder.  Make sure that both sides of the stepladder are locked into place.  Ask someone to hold it for you, if possible.  Clearly mark and make sure that you can see:  Any grab bars or handrails.  First and last steps.  Where the edge of each step is.  Use tools that help you move around (mobility aids) if they are needed. These include:  Canes.  Walkers.  Scooters.  Crutches.  Turn on the lights when you go into a dark area. Replace any light bulbs as soon as they burn out.  Set up your furniture so you have a clear path. Avoid moving your furniture around.  If any of your floors are uneven, fix them.  If there are any pets around you, be aware of where they are.  Review your medicines with your doctor. Some medicines can make you feel dizzy. This can increase your chance of falling. Ask your doctor what other things that you can do to help prevent falls. This information is not intended to replace advice given to you by your health care provider. Make sure you discuss any questions you have with your health care provider. Document Released: 08/23/2009 Document Revised: 04/03/2016 Document Reviewed: 12/01/2014 Elsevier Interactive Patient Education  2017 [employer].

## 2021-09-02 ENCOUNTER — Other Ambulatory Visit: Payer: Self-pay

## 2021-09-02 ENCOUNTER — Encounter: Payer: Self-pay | Admitting: Family Medicine

## 2021-09-02 ENCOUNTER — Ambulatory Visit (INDEPENDENT_AMBULATORY_CARE_PROVIDER_SITE_OTHER): Payer: Medicare Other | Admitting: Family Medicine

## 2021-09-02 VITALS — BP 135/79 | HR 97 | Ht 64.0 in | Wt 198.0 lb

## 2021-09-02 DIAGNOSIS — E782 Mixed hyperlipidemia: Secondary | ICD-10-CM

## 2021-09-02 DIAGNOSIS — I10 Essential (primary) hypertension: Secondary | ICD-10-CM

## 2021-09-02 DIAGNOSIS — R7989 Other specified abnormal findings of blood chemistry: Secondary | ICD-10-CM

## 2021-09-02 MED ORDER — LOSARTAN POTASSIUM-HCTZ 50-12.5 MG PO TABS: 1.0000 | ORAL_TABLET | Freq: Every day | ORAL | 3 refills | Status: DC

## 2021-09-02 NOTE — Progress Notes (Signed)
BP 135/79   Pulse 97   Ht $R'5\' 4"'uA$  (1.626 m)   Wt 198 lb (89.8 kg)   SpO2 94%   BMI 33.99 kg/m    Subjective:   Patient ID: Beth Bryant, female    DOB: 1946/04/30, 75 y.o.   MRN: [id number]  HPI: Beth Bryant is a 75 y.o. female presenting on 09/02/2021 for Medical Management of Chronic Issues, Hyperlipidemia, and Hypertension   HPI Hyperlipidemia Patient is coming in for recheck of his hyperlipidemia. The patient is currently taking fish oils, refuses statin. They deny any issues with myalgias or history of liver damage from it. They deny any focal numbness or weakness or chest pain.   Hypertension Patient is currently on losartan hydrochlorothiazide, and their blood pressure today is 135/79. Patient denies any lightheadedness or dizziness. Patient denies headaches, blurred vision, chest pains, shortness of breath, or weakness. Denies any side effects from medication and is content with current medication.   Relevant past medical, surgical, family and social history reviewed and updated as indicated. Interim medical history since our last visit reviewed. Allergies and medications reviewed and updated.  Review of Systems  Constitutional:  Negative for chills and fever.  Eyes:  Negative for visual disturbance.  Respiratory:  Negative for chest tightness and shortness of breath.   Cardiovascular:  Negative for chest pain and leg swelling.  Musculoskeletal:  Negative for back pain and gait problem.  Skin:  Negative for rash.  Neurological:  Negative for dizziness, light-headedness and headaches.  Psychiatric/Behavioral:  Negative for agitation and behavioral problems.   All other systems reviewed and are negative.  Per HPI unless specifically indicated above   Allergies as of 09/02/2021   No Known Allergies      Medication List        Accurate as of September 02, 2021  2:02 PM. If you have any questions, ask your nurse or doctor.          STOP taking  these medications    multivitamin-lutein Caps capsule Stopped by: Fransisca Kaufmann Adrinne Sze, MD       TAKE these medications    ENZYME DIGEST PO Take by mouth daily.   Fish Oil 1000 MG Caps Take by mouth.   losartan-hydrochlorothiazide 50-12.5 MG tablet Commonly known as: HYZAAR Take 1 tablet by mouth daily.   multivitamin capsule Take 1 capsule by mouth daily.   PROBIOTIC-10 PO Take by mouth daily.   vitamin C 100 MG tablet Take 100 mg by mouth daily.         Objective:   BP 135/79   Pulse 97   Ht $R'5\' 4"'dG$  (1.626 m)   Wt 198 lb (89.8 kg)   SpO2 94%   BMI 33.99 kg/m   Wt Readings from Last 3 Encounters:  09/02/21 198 lb (89.8 kg)  04/05/21 200 lb (90.7 kg)  03/07/21 204 lb (92.5 kg)    Physical Exam Vitals and nursing note reviewed.  Constitutional:      General: She is not in acute distress.    Appearance: She is well-developed. She is not diaphoretic.  Eyes:     Conjunctiva/sclera: Conjunctivae normal.     Pupils: Pupils are equal, round, and reactive to light.  Cardiovascular:     Rate and Rhythm: Normal rate and regular rhythm.     Heart sounds: Normal heart sounds. No murmur heard. Pulmonary:     Effort: Pulmonary effort is normal. No respiratory distress.     Breath sounds: Normal breath  sounds. No wheezing.  Abdominal:     General: Abdomen is flat. Bowel sounds are normal. There is no distension.     Palpations: Abdomen is soft.     Tenderness: There is no abdominal tenderness. There is no guarding or rebound.     Hernia: No hernia is present.  Musculoskeletal:        General: No tenderness. Normal range of motion.  Skin:    General: Skin is warm and dry.     Findings: No rash.  Neurological:     Mental Status: She is alert and oriented to person, place, and time.     Coordination: Coordination normal.  Psychiatric:        Behavior: Behavior normal.      Assessment & Plan:   Problem List Items Addressed This Visit       Cardiovascular  and Mediastinum   Essential hypertension   Relevant Medications   losartan-hydrochlorothiazide (HYZAAR) 50-12.5 MG tablet   Other Relevant Orders   CBC with Differential/Platelet     Other   Hyperlipidemia - Primary   Relevant Medications   losartan-hydrochlorothiazide (HYZAAR) 50-12.5 MG tablet   Other Relevant Orders   CMP14+EGFR   Lipid panel   Elevated liver function tests   Relevant Orders   CMP14+EGFR    Patient still refuses cholesterol medicine, will keep blood pressure the same, looks good.  Do blood work today  We are monitoring closely liver function Follow up plan: Return in about 6 months (around 03/03/2022), or if symptoms worsen or fail to improve, for physical and htn.  Counseling provided for all of the vaccine components Orders Placed This Encounter  Procedures   CBC with Differential/Platelet   CMP14+EGFR   Lipid panel    Caryl Pina, MD [facility] 09/02/2021, 2:02 PM

## 2021-09-03 LAB — LIPID PANEL
Chol/HDL Ratio: 2.5 ratio (ref 0.0–4.4)
Cholesterol, Total: 204 mg/dL — ABNORMAL HIGH (ref 100–199)
HDL: 81 mg/dL (ref >39)
LDL Chol Calc (NIH): 111 mg/dL — ABNORMAL HIGH (ref 0–99)
Triglycerides: 65 mg/dL (ref 0–149)
VLDL Cholesterol Cal: 12 mg/dL (ref 5–40)

## 2021-09-03 LAB — CMP14+EGFR
ALT: 69 IU/L — ABNORMAL HIGH (ref 0–32)
AST: 72 IU/L — ABNORMAL HIGH (ref 0–40)
Albumin/Globulin Ratio: 1.3 (ref 1.2–2.2)
Albumin: 4.4 g/dL (ref 3.7–4.7)
Alkaline Phosphatase: 82 IU/L (ref 44–121)
BUN/Creatinine Ratio: 15 (ref 12–28)
BUN: 13 mg/dL (ref 8–27)
Bilirubin Total: 0.6 mg/dL (ref 0.0–1.2)
CO2: 26 mmol/L (ref 20–29)
Calcium: 10.1 mg/dL (ref 8.7–10.3)
Chloride: 101 mmol/L (ref 96–106)
Creatinine, Ser: 0.85 mg/dL (ref 0.57–1.00)
Globulin, Total: 3.5 g/dL (ref 1.5–4.5)
Glucose: 97 mg/dL (ref 70–99)
Potassium: 5 mmol/L (ref 3.5–5.2)
Sodium: 140 mmol/L (ref 134–144)
Total Protein: 7.9 g/dL (ref 6.0–8.5)
eGFR: 72 mL/min/{1.73_m2} (ref >59)

## 2021-09-03 LAB — CBC WITH DIFFERENTIAL/PLATELET
Basophils Absolute: 0 10*3/uL (ref 0.0–0.2)
Basos: 1 %
EOS (ABSOLUTE): 0.2 10*3/uL (ref 0.0–0.4)
Eos: 3 %
Hematocrit: 40.6 % (ref 34.0–46.6)
Hemoglobin: 14 g/dL (ref 11.1–15.9)
Immature Grans (Abs): 0 10*3/uL (ref 0.0–0.1)
Immature Granulocytes: 0 %
Lymphocytes Absolute: 2.5 10*3/uL (ref 0.7–3.1)
Lymphs: 37 %
MCH: 32.9 pg (ref 26.6–33.0)
MCHC: 34.5 g/dL (ref 31.5–35.7)
MCV: 96 fL (ref 79–97)
Monocytes Absolute: 0.7 10*3/uL (ref 0.1–0.9)
Monocytes: 9 %
Neutrophils Absolute: 3.5 10*3/uL (ref 1.4–7.0)
Neutrophils: 50 %
Platelets: 160 10*3/uL (ref 150–450)
RBC: 4.25 x10E6/uL (ref 3.77–5.28)
RDW: 12.4 % (ref 11.7–15.4)
WBC: 6.9 10*3/uL (ref 3.4–10.8)

## 2022-02-28 ENCOUNTER — Ambulatory Visit (INDEPENDENT_AMBULATORY_CARE_PROVIDER_SITE_OTHER): Payer: Medicare Other | Admitting: Family Medicine

## 2022-02-28 ENCOUNTER — Encounter: Payer: Self-pay | Admitting: Family Medicine

## 2022-02-28 VITALS — BP 146/86 | HR 81 | Ht 64.0 in | Wt 196.0 lb

## 2022-02-28 DIAGNOSIS — E782 Mixed hyperlipidemia: Secondary | ICD-10-CM | POA: Diagnosis not present

## 2022-02-28 DIAGNOSIS — R7989 Other specified abnormal findings of blood chemistry: Secondary | ICD-10-CM | POA: Diagnosis not present

## 2022-02-28 DIAGNOSIS — I10 Essential (primary) hypertension: Secondary | ICD-10-CM

## 2022-02-28 NOTE — Progress Notes (Signed)
? ?BP (!) 146/86   Pulse 81   Ht _0  (1.626 m)   Wt 196 lb (88.9 kg)   SpO2 95%   BMI 33.64 kg/m?   ? ?Subjective:  ? ?Patient ID: Beth Bryant, female    DOB: 04/16/1946, 76 y.o.   MRN: [id number] ? ?HPI: ?Beth Bryant is a 76 y.o. female presenting on 02/28/2022 for Medical Management of Chronic Issues, Hyperlipidemia, and Hypertension ? ? ?HPI ?Hypertension ?Patient is currently on losartan hydrochlorothiazide, and their blood pressure today is 146/86. Patient denies any lightheadedness or dizziness. Patient denies headaches, blurred vision, chest pains, shortness of breath, or weakness. Denies any side effects from medication and is content with current medication.  ? ?Hyperlipidemia ?Patient is coming in for recheck of his hyperlipidemia. The patient is currently taking fish oils. They deny any issues with myalgias or history of liver damage from it. They deny any focal numbness or weakness or chest pain.  ? ?Patient has had elevated liver function tests and the past, she is declined ultrasound, we will recheck the levels and monitor them closely. ? ?Relevant past medical, surgical, family and social history reviewed and updated as indicated. Interim medical history since our last visit reviewed. ?Allergies and medications reviewed and updated. ? ?Review of Systems  ?Constitutional:  Negative for chills and fever.  ?Eyes:  Negative for redness and visual disturbance.  ?Respiratory:  Negative for chest tightness and shortness of breath.   ?Cardiovascular:  Negative for chest pain and leg swelling.  ?Musculoskeletal:  Negative for back pain and gait problem.  ?Skin:  Negative for rash.  ?Neurological:  Negative for light-headedness and headaches.  ?Psychiatric/Behavioral:  Negative for agitation and behavioral problems.   ?All other systems reviewed and are negative. ? ?Per HPI unless specifically indicated above ? ? ?Allergies as of 02/28/2022   ?No Known Allergies ?  ? ?  ?Medication List  ?   ? ?  ? Accurate as of February 28, 2022  1:37 PM. If you have any questions, ask your nurse or doctor.  ?  ?  ? ?  ? ?Collagen 1500/C 500-50-0.8 MG Caps ?Generic drug: Collagen-Vitamin C-Biotin ?Take by mouth daily. ?  ?D3-1000 PO ?Take by mouth daily. ?  ?ENZYME DIGEST PO ?Take by mouth daily. ?  ?Fish Oil 1000 MG Caps ?Take by mouth. ?  ?K2-45 45 MCG Caps ?Generic drug: Menaquinone-7 ?Take by mouth daily. ?  ?losartan-hydrochlorothiazide 50-12.5 MG tablet ?Commonly known as: HYZAAR ?Take 1 tablet by mouth daily. ?  ?multivitamin capsule ?Take 1 capsule by mouth daily. ?  ?PROBIOTIC-10 PO ?Take by mouth daily. ?  ?vitamin C 100 MG tablet ?Take 100 mg by mouth daily. ?  ? ?  ? ? ? ?Objective:  ? ?BP (!) 146/86   Pulse 81   Ht _1  (1.626 m)   Wt 196 lb (88.9 kg)   SpO2 95%   BMI 33.64 kg/m?   ?Wt Readings from Last 3 Encounters:  ?02/28/22 196 lb (88.9 kg)  ?09/02/21 198 lb (89.8 kg)  ?04/05/21 200 lb (90.7 kg)  ?  ?Physical Exam ?Vitals and nursing note reviewed.  ?Constitutional:   ?   General: She is not in acute distress. ?   Appearance: She is well-developed. She is not diaphoretic.  ?Eyes:  ?   Conjunctiva/sclera: Conjunctivae normal.  ?Cardiovascular:  ?   Rate and Rhythm: Normal rate and regular rhythm.  ?   Heart sounds: Normal heart sounds. No murmur heard. ?Pulmonary:  ?  Effort: Pulmonary effort is normal. No respiratory distress.  ?   Breath sounds: Normal breath sounds. No wheezing.  ?Musculoskeletal:     ?   General: No swelling or tenderness. Normal range of motion.  ?Skin: ?   General: Skin is warm and dry.  ?   Findings: No rash.  ?Neurological:  ?   Mental Status: She is alert and oriented to person, place, and time.  ?   Coordination: Coordination normal.  ?Psychiatric:     ?   Behavior: Behavior normal.  ? ? ? ? ?Assessment & Plan:  ? ?Problem List Items Addressed This Visit   ? ?  ? Cardiovascular and Mediastinum  ? Essential hypertension - Primary  ? Relevant Orders  ? CBC with  Differential/Platelet  ? CMP14+EGFR  ? Lipid panel  ?  ? Other  ? Hyperlipidemia  ? Relevant Orders  ? CBC with Differential/Platelet  ? CMP14+EGFR  ? Lipid panel  ? Elevated liver function tests  ?  ?Continue current medicine, blood pressure slightly elevated, allowing permissive hypertension for age.  No changes today. ?Follow up plan: ?Return in about 6 months (around 08/30/2022), or if symptoms worsen or fail to improve, for Hyperlipidemia and hypertension and elevated liver function recheck. ? ?Counseling provided for all of the vaccine components ?Orders Placed This Encounter  ?Procedures  ? CBC with Differential/Platelet  ? CMP14+EGFR  ? Lipid panel  ? ? ?Caryl Pina, MD ?[place] ?02/28/2022, 1:37 PM ? ? ? ? ?

## 2022-03-01 LAB — CMP14+EGFR
ALT: 51 IU/L — ABNORMAL HIGH (ref 0–32)
AST: 51 IU/L — ABNORMAL HIGH (ref 0–40)
Albumin/Globulin Ratio: 1.3 (ref 1.2–2.2)
Albumin: 4.3 g/dL (ref 3.7–4.7)
Alkaline Phosphatase: 77 IU/L (ref 44–121)
BUN/Creatinine Ratio: 19 (ref 12–28)
BUN: 15 mg/dL (ref 8–27)
Bilirubin Total: 0.5 mg/dL (ref 0.0–1.2)
CO2: 27 mmol/L (ref 20–29)
Calcium: 10 mg/dL (ref 8.7–10.3)
Chloride: 98 mmol/L (ref 96–106)
Creatinine, Ser: 0.8 mg/dL (ref 0.57–1.00)
Globulin, Total: 3.3 g/dL (ref 1.5–4.5)
Glucose: 87 mg/dL (ref 70–99)
Potassium: 5.1 mmol/L (ref 3.5–5.2)
Sodium: 138 mmol/L (ref 134–144)
Total Protein: 7.6 g/dL (ref 6.0–8.5)
eGFR: 77 mL/min/{1.73_m2} (ref >59)

## 2022-03-01 LAB — CBC WITH DIFFERENTIAL/PLATELET
Basophils Absolute: 0 10*3/uL (ref 0.0–0.2)
Basos: 1 %
EOS (ABSOLUTE): 0.2 10*3/uL (ref 0.0–0.4)
Eos: 2 %
Hematocrit: 41 % (ref 34.0–46.6)
Hemoglobin: 14.3 g/dL (ref 11.1–15.9)
Immature Grans (Abs): 0 10*3/uL (ref 0.0–0.1)
Immature Granulocytes: 0 %
Lymphocytes Absolute: 2.3 10*3/uL (ref 0.7–3.1)
Lymphs: 36 %
MCH: 32.5 pg (ref 26.6–33.0)
MCHC: 34.9 g/dL (ref 31.5–35.7)
MCV: 93 fL (ref 79–97)
Monocytes Absolute: 0.6 10*3/uL (ref 0.1–0.9)
Monocytes: 9 %
Neutrophils Absolute: 3.4 10*3/uL (ref 1.4–7.0)
Neutrophils: 52 %
Platelets: 150 10*3/uL (ref 150–450)
RBC: 4.4 x10E6/uL (ref 3.77–5.28)
RDW: 12.5 % (ref 11.7–15.4)
WBC: 6.5 10*3/uL (ref 3.4–10.8)

## 2022-03-01 LAB — LIPID PANEL
Chol/HDL Ratio: 2.5 ratio (ref 0.0–4.4)
Cholesterol, Total: 208 mg/dL — ABNORMAL HIGH (ref 100–199)
HDL: 83 mg/dL (ref >39)
LDL Chol Calc (NIH): 106 mg/dL — ABNORMAL HIGH (ref 0–99)
Triglycerides: 112 mg/dL (ref 0–149)
VLDL Cholesterol Cal: 19 mg/dL (ref 5–40)

## 2022-04-09 ENCOUNTER — Ambulatory Visit (INDEPENDENT_AMBULATORY_CARE_PROVIDER_SITE_OTHER): Payer: Medicare Other

## 2022-04-09 VITALS — Wt 194.0 lb

## 2022-04-09 DIAGNOSIS — Z0000 Encounter for general adult medical examination without abnormal findings: Secondary | ICD-10-CM

## 2022-04-09 NOTE — Progress Notes (Signed)
Subjective:   Shaketta Rill is a 76 y.o. female who presents for Medicare Annual (Subsequent) preventive examination.  Virtual Visit via Telephone Note  I connected with  [name] on 04/09/22 at  8:15 AM EDT by telephone and verified that I am speaking with the correct person using two identifiers.  Location: Patient: Home Provider: [facility] Persons participating in the virtual visit: patient/Nurse Health Advisor   I discussed the limitations, risks, security and privacy concerns of performing an evaluation and management service by telephone and the availability of in person appointments. The patient expressed understanding and agreed to proceed.  Interactive audio and video telecommunications were attempted between this nurse and patient, however failed, due to patient having technical difficulties OR patient did not have access to video capability.  We continued and completed visit with audio only.  Some vital signs may be absent or patient reported.   Ronney Honeywell E Delita Chiquito, LPN   Review of Systems     Cardiac Risk Factors include: advanced age (>34men, >62 women);obesity (BMI >30kg/m2);dyslipidemia;hypertension     Objective:    Today's Vitals   04/09/22 0819  Weight: 194 lb (88 kg)  PainSc: 4    Body mass index is 33.3 kg/m.     04/09/2022    8:34 AM 04/05/2021    8:18 AM 04/04/2020    8:36 AM 03/31/2019    8:50 AM  Advanced Directives  Does Patient Have a Medical Advance Directive? Yes Yes Yes Yes  Type of Estate agent of [place];Living will Healthcare Power of [place];Living will Living will Healthcare Power of Attorney  Does patient want to make changes to medical advance directive?   No - Patient declined   Copy of Healthcare Power of Attorney in Chart? No - copy requested No - copy requested  No - copy requested    Current Medications (verified) Outpatient Encounter Medications as of 04/09/2022  Medication Sig   Ascorbic Acid  (VITAMIN C) 100 MG tablet Take 100 mg by mouth daily.   Cholecalciferol (D3-1000 PO) Take by mouth daily.   Collagen-Vitamin C-Biotin (COLLAGEN 1500/C) 500-50-0.8 MG CAPS Take by mouth daily.   Digestive Enzymes (ENZYME DIGEST PO) Take by mouth daily.   losartan-hydrochlorothiazide (HYZAAR) 50-12.5 MG tablet Take 1 tablet by mouth daily.   Menaquinone-7 (K2-45) 45 MCG CAPS Take by mouth daily.   Multiple Vitamin (MULTIVITAMIN) capsule Take 1 capsule by mouth daily.   Omega-3 Fatty Acids (FISH OIL) 1000 MG CAPS Take by mouth.   Probiotic Product (PROBIOTIC-10 PO) Take by mouth daily.   No facility-administered encounter medications on file as of 04/09/2022.    Allergies (verified) Patient has no known allergies.   History: Past Medical History:  Diagnosis Date   Arthritis    DDD (degenerative disc disease), lumbar    Hypertension    Scoliosis    Past Surgical History:  Procedure Laterality Date   hemorrhoid surg.      TUBAL LIGATION     Family History  Problem Relation Age of Onset   Stroke Mother    Hypertension Mother    Heart disease Father        MI at 31   Arthritis Brother    Heart disease Brother    Epilepsy Son    Cerebral palsy Son    Learning disabilities Son    Social History   Socioeconomic History   Marital status: Married    Spouse name: Ray   Number of children: 2  Years of education: Not on file   Highest education level: High school graduate  Occupational History   Occupation: Retired  Tobacco Use   Smoking status: Never   Smokeless tobacco: Never  Vaping Use   Vaping Use: Never used  Substance and Sexual Activity   Alcohol use: No    Alcohol/week: 0.0 standard drinks   Drug use: No   Sexual activity: Yes    Birth control/protection: Surgical  Other Topics Concern   Not on file  Social History Narrative   Lives home with husband. One daughter who lives in [place]   Social Determinants of Health   Financial Resource Strain: Low  Risk    Difficulty of Paying Living Expenses: Not hard at all  Food Insecurity: No Food Insecurity   Worried About Programme researcher, broadcasting/film/video in the Last Year: Never true   Barista in the Last Year: Never true  Transportation Needs: No Transportation Needs   Lack of Transportation (Medical): No   Lack of Transportation (Non-Medical): No  Physical Activity: Sufficiently Active   Days of Exercise per Week: 5 days   Minutes of Exercise per Session: 30 min  Stress: No Stress Concern Present   Feeling of Stress : Only a little  Social Connections: Press photographer of Communication with Friends and Family: More than three times a week   Frequency of Social Gatherings with Friends and Family: More than three times a week   Attends Religious Services: More than 4 times per year   Active Member of [employer] or Organizations: Yes   Attends Engineer, structural: More than 4 times per year   Marital Status: Married    Tobacco Counseling Counseling given: Not Answered   Clinical Intake:  Pre-visit preparation completed: Yes  Pain : 0-10 Pain Score: 4  Pain Type: Chronic pain Pain Location: Generalized Pain Descriptors / Indicators: Aching, Discomfort Pain Onset: More than a month ago Pain Frequency: Intermittent     BMI - recorded: 33.3 Nutritional Status: BMI > 30  Obese Nutritional Risks: None Diabetes: No  How often do you need to have someone help you when you read instructions, pamphlets, or other written materials from your doctor or pharmacy?: 1 - Never  Diabetic? no  Interpreter Needed?: No  Information entered by :: Neiman Roots, LPN   Activities of Daily Living    04/09/2022    8:34 AM  In your present state of health, do you have any difficulty performing the following activities:  Hearing? 0  Vision? 0  Difficulty concentrating or making decisions? 0  Walking or climbing stairs? 0  Dressing or bathing? 0  Doing errands, shopping? 0   Preparing Food and eating ? N  Using the Toilet? N  In the past six months, have you accidently leaked urine? N  Do you have problems with loss of bowel control? N  Managing your Medications? N  Managing your Finances? N  Housekeeping or managing your Housekeeping? N    Patient Care Team: Dettinger, Elige Radon, MD as PCP - General (Family Medicine) Duffy Rhody, DC (Chiropractic Medicine) Moody Bruins as Physician Assistant (Chiropractic Medicine)  Indicate any recent Medical Services you may have received from other than [facility] providers in the past year (date may be approximate).     Assessment:   This is a routine wellness examination for [place].  Hearing/Vision screen Hearing Screening - Comments:: Denies hearing difficulties   Vision Screening - Comments::  Wears reading glasses prn - behind with routine eye exams. 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), Time (Minutes): 30, Frequency (Times/Week): 5, Weekly Exercise (Minutes/Week): 150, Intensity: Mild, Exercise limited by: None identified   Goals Addressed             This Visit's Progress    DIET - EAT MORE FRUITS AND VEGETABLES   On track    Exercise 150 min/wk Moderate Activity   On track      Depression Screen    04/09/2022    8:22 AM 02/28/2022    1:21 PM 09/02/2021    1:27 PM 04/05/2021    8:17 AM 03/07/2021    1:17 PM 09/07/2020    1:12 PM 04/04/2020    8:37 AM  PHQ 2/9 Scores  PHQ - 2 Score 0 0 0 0 0 0 0  PHQ- 9 Score 0 0 0        Fall Risk    04/09/2022    8:21 AM 02/28/2022    1:20 PM 09/02/2021    1:27 PM 04/05/2021    8:18 AM 03/07/2021    1:17 PM  Fall Risk   Falls in the past year? 0 0 0 0 0  Number falls in past yr: 0   0   Injury with Fall? 0   0   Risk for fall due to : No Fall Risks   Orthopedic patient   Follow up Falls prevention  discussed   Falls prevention discussed     FALL RISK PREVENTION PERTAINING TO THE HOME:  Any stairs in or around the home? No  If so, are there any without handrails? No  Home free of loose throw rugs in walkways, pet beds, electrical cords, etc? Yes  Adequate lighting in your home to reduce risk of falls? Yes   ASSISTIVE DEVICES UTILIZED TO PREVENT FALLS:  Life alert? No  Use of a cane, walker or w/c? No  Grab bars in the bathroom? No  Shower chair or bench in shower? Yes  Elevated toilet seat or a handicapped toilet? No   TIMED UP AND GO:  Was the test performed? No . Telephonic visit  Cognitive Function:        04/09/2022    8:24 AM 04/05/2021    8:20 AM 04/04/2020    8:40 AM 03/31/2019    8:54 AM  6CIT Screen  What Year? 0 points 0 points 0 points 0 points  What month? 0 points 0 points 0 points 0 points  What time? 0 points 0 points 0 points 0 points  Count back from 20 0 points 0 points 0 points 0 points  Months in reverse 0 points 0 points 0 points 0 points  Repeat phrase 0 points 0 points 0 points 0 points  Total Score 0 points 0 points 0 points 0 points    Immunizations Immunization History  Administered Date(s) Administered   Influenza-Unspecified 09/10/2018    TDAP status: Due, Education has been provided regarding the importance of this vaccine. Advised may receive this vaccine at local pharmacy or Health Dept. Aware to provide a copy of the vaccination record if obtained from local pharmacy or Health Dept. Verbalized acceptance and understanding.  Flu Vaccine status: Declined, Education has been provided regarding the importance of this vaccine but patient still declined. Advised may receive this vaccine at local pharmacy or  Health Dept. Aware to provide a copy of the vaccination record if obtained from local pharmacy or Health Dept. Verbalized acceptance and understanding.  Pneumococcal vaccine status: Declined,  Education has been provided regarding the  importance of this vaccine but patient still declined. Advised may receive this vaccine at local pharmacy or Health Dept. Aware to provide a copy of the vaccination record if obtained from local pharmacy or Health Dept. Verbalized acceptance and understanding.   Covid-19 vaccine status: Declined, Education has been provided regarding the importance of this vaccine but patient still declined. Advised may receive this vaccine at local pharmacy or Health Dept.or vaccine clinic. Aware to provide a copy of the vaccination record if obtained from local pharmacy or Health Dept. Verbalized acceptance and understanding.  Qualifies for Shingles Vaccine? No   Zostavax completed No   Shingrix Completed?: No.    Education has been provided regarding the importance of this vaccine. Patient has been advised to call insurance company to determine out of pocket expense if they have not yet received this vaccine. Advised may also receive vaccine at local pharmacy or Health Dept. Verbalized acceptance and understanding.  Screening Tests Health Maintenance  Topic Date Due   TETANUS/TDAP  Never done   COLONOSCOPY (Pts 45-33yrs Insurance coverage will need to be confirmed)  Never done   DEXA SCAN  Never done   Pneumonia Vaccine 22+ Years old (1 - PCV) 09/02/2022 (Originally 02/27/2013)   MAMMOGRAM  03/01/2023 (Originally 02/27/1998)   Zoster Vaccines- Shingrix (1 of 2) 03/16/2023 (Originally 02/27/1998)   INFLUENZA VACCINE  06/10/2022   Hepatitis C Screening  Completed   HPV VACCINES  Aged Out   COVID-19 Vaccine  Discontinued    Health Maintenance  Health Maintenance Due  Topic Date Due   TETANUS/TDAP  Never done   COLONOSCOPY (Pts 45-66yrs Insurance coverage will need to be confirmed)  Never done   DEXA SCAN  Never done    Colorectal cancer screening: No longer required.   Mammogram status: No longer required due to declines.  Declines DEXA  Lung Cancer Screening: (Low Dose CT Chest recommended if Age  30-80 years, 30 pack-year currently smoking OR have quit w/in 15years.) does not qualify.   Additional Screening:  Hepatitis C Screening: does qualify; Completed 07/29/2017  Vision Screening: Recommended annual ophthalmology exams for early detection of glaucoma and other disorders of the eye. Is the patient up to date with their annual eye exam?  No  Who is the provider or what is the name of the office in which the patient attends annual eye exams? none If pt is not established with a provider, would they like to be referred to a provider to establish care? No .   Dental Screening: Recommended annual dental exams for proper oral hygiene  Community Resource Referral / Chronic Care Management: CRR required this visit?  No   CCM required this visit?  No      Plan:     I have personally reviewed and noted the following in the patient's chart:   Medical and social history Use of alcohol, tobacco or illicit drugs  Current medications and supplements including opioid prescriptions.  Functional ability and status Nutritional status Physical activity Advanced directives List of other physicians Hospitalizations, surgeries, and ER visits in previous 12 months Vitals Screenings to include cognitive, depression, and falls Referrals and appointments  In addition, I have reviewed and discussed with patient certain preventive protocols, quality metrics, and best practice recommendations. A written personalized  care plan for preventive services as well as general preventive health recommendations were provided to patient.     [place] Constable, LPN   1/61/0960   Nurse Notes: None

## 2022-04-09 NOTE — Patient Instructions (Signed)
Beth Bryant , Thank you for taking time to come for your Medicare Wellness Visit. I appreciate your ongoing commitment to your health goals. Please review the following plan we discussed and let me know if I can assist you in the future.   Screening recommendations/referrals: Colonoscopy: declines - cologuard is an at home non-invasive alternative Mammogram: Declines - recommended every year Bone Density: Declines - recommended every 2 years for patients 74 and older Recommended yearly ophthalmology/optometry visit for glaucoma screening and checkup Recommended yearly dental visit for hygiene and checkup  Vaccinations: declines all Influenza vaccine: recommend every Fall Pneumococcal vaccine: recommend once per lifetime Prevnar-20 Tdap vaccine: recommend every 10 years Shingles vaccine: recommend Shingrix which is 2 doses 2-6 months apart and over 90% effective     Covid-19: recommend 2 doses one month apart with a booster 6 months later  Advanced directives: Please bring a copy of your health care power of attorney and living will to the office to be added to your chart at your convenience.   Conditions/risks identified: Keep up the great work! Aim for 30 minutes of exercise or brisk walking, 6-8 glasses of water, and 5 servings of fruits and vegetables each day.   Next appointment: Follow up in one year for your annual wellness visit    Preventive Care 65 Years and Older, Female Preventive care refers to lifestyle choices and visits with your health care provider that can promote health and wellness. What does preventive care include? A yearly physical exam. This is also called an annual well check. Dental exams once or twice a year. Routine eye exams. Ask your health care provider how often you should have your eyes checked. Personal lifestyle choices, including: Daily care of your teeth and gums. Regular physical activity. Eating a healthy diet. Avoiding tobacco and drug  use. Limiting alcohol use. Practicing safe sex. Taking low-dose aspirin every day. Taking vitamin and mineral supplements as recommended by your health care provider. What happens during an annual well check? The services and screenings done by your health care provider during your annual well check will depend on your age, overall health, lifestyle risk factors, and family history of disease. Counseling  Your health care provider may ask you questions about your: Alcohol use. Tobacco use. Drug use. Emotional well-being. Home and relationship well-being. Sexual activity. Eating habits. History of falls. Memory and ability to understand (cognition). Work and work Astronomer. Reproductive health. Screening  You may have the following tests or measurements: Height, weight, and BMI. Blood pressure. Lipid and cholesterol levels. These may be checked every 5 years, or more frequently if you are over 65 years old. Skin check. Lung cancer screening. You may have this screening every year starting at age 69 if you have a 30-pack-year history of smoking and currently smoke or have quit within the past 15 years. Fecal occult blood test (FOBT) of the stool. You may have this test every year starting at age 72. Flexible sigmoidoscopy or colonoscopy. You may have a sigmoidoscopy every 5 years or a colonoscopy every 10 years starting at age 44. Hepatitis C blood test. Hepatitis B blood test. Sexually transmitted disease (STD) testing. Diabetes screening. This is done by checking your blood sugar (glucose) after you have not eaten for a while (fasting). You may have this done every 1-3 years. Bone density scan. This is done to screen for osteoporosis. You may have this done starting at age 51. Mammogram. This may be done every 1-2 years. Talk to  your health care provider about how often you should have regular mammograms. Talk with your health care provider about your test results, treatment  options, and if necessary, the need for more tests. Vaccines  Your health care provider may recommend certain vaccines, such as: Influenza vaccine. This is recommended every year. Tetanus, diphtheria, and acellular pertussis (Tdap, Td) vaccine. You may need a Td booster every 10 years. Zoster vaccine. You may need this after age 65. Pneumococcal 13-valent conjugate (PCV13) vaccine. One dose is recommended after age 9. Pneumococcal polysaccharide (PPSV23) vaccine. One dose is recommended after age 76. Talk to your health care provider about which screenings and vaccines you need and how often you need them. This information is not intended to replace advice given to you by your health care provider. Make sure you discuss any questions you have with your health care provider. Document Released: 11/23/2015 Document Revised: 07/16/2016 Document Reviewed: 08/28/2015 Elsevier Interactive Patient Education  2017 [place] Prevention in the Home Falls can cause injuries. They can happen to people of all ages. There are many things you can do to make your home safe and to help prevent falls. What can I do on the outside of my home? Regularly fix the edges of walkways and driveways and fix any cracks. Remove anything that might make you trip as you walk through a door, such as a raised step or threshold. Trim any bushes or trees on the path to your home. Use bright outdoor lighting. Clear any walking paths of anything that might make someone trip, such as rocks or tools. Regularly check to see if handrails are loose or broken. Make sure that both sides of any steps have handrails. Any raised decks and porches should have guardrails on the edges. Have any leaves, snow, or ice cleared regularly. Use sand or salt on walking paths during winter. Clean up any spills in your garage right away. This includes oil or grease spills. What can I do in the bathroom? Use night lights. Install grab  bars by the toilet and in the tub and shower. Do not use towel bars as grab bars. Use non-skid mats or decals in the tub or shower. If you need to sit down in the shower, use a plastic, non-slip stool. Keep the floor dry. Clean up any water that spills on the floor as soon as it happens. Remove soap buildup in the tub or shower regularly. Attach bath mats securely with double-sided non-slip rug tape. Do not have throw rugs and other things on the floor that can make you trip. What can I do in the bedroom? Use night lights. Make sure that you have a light by your bed that is easy to reach. Do not use any sheets or blankets that are too big for your bed. They should not hang down onto the floor. Have a firm chair that has side arms. You can use this for support while you get dressed. Do not have throw rugs and other things on the floor that can make you trip. What can I do in the kitchen? Clean up any spills right away. Avoid walking on wet floors. Keep items that you use a lot in easy-to-reach places. If you need to reach something above you, use a strong step stool that has a grab bar. Keep electrical cords out of the way. Do not use floor polish or wax that makes floors slippery. If you must use wax, use non-skid floor wax. Do not  have throw rugs and other things on the floor that can make you trip. What can I do with my stairs? Do not leave any items on the stairs. Make sure that there are handrails on both sides of the stairs and use them. Fix handrails that are broken or loose. Make sure that handrails are as long as the stairways. Check any carpeting to make sure that it is firmly attached to the stairs. Fix any carpet that is loose or worn. Avoid having throw rugs at the top or bottom of the stairs. If you do have throw rugs, attach them to the floor with carpet tape. Make sure that you have a light switch at the top of the stairs and the bottom of the stairs. If you do not have them,  ask someone to add them for you. What else can I do to help prevent falls? Wear shoes that: Do not have high heels. Have rubber bottoms. Are comfortable and fit you well. Are closed at the toe. Do not wear sandals. If you use a stepladder: Make sure that it is fully opened. Do not climb a closed stepladder. Make sure that both sides of the stepladder are locked into place. Ask someone to hold it for you, if possible. Clearly mark and make sure that you can see: Any grab bars or handrails. First and last steps. Where the edge of each step is. Use tools that help you move around (mobility aids) if they are needed. These include: Canes. Walkers. Scooters. Crutches. Turn on the lights when you go into a dark area. Replace any light bulbs as soon as they burn out. Set up your furniture so you have a clear path. Avoid moving your furniture around. If any of your floors are uneven, fix them. If there are any pets around you, be aware of where they are. Review your medicines with your doctor. Some medicines can make you feel dizzy. This can increase your chance of falling. Ask your doctor what other things that you can do to help prevent falls. This information is not intended to replace advice given to you by your health care provider. Make sure you discuss any questions you have with your health care provider. Document Released: 08/23/2009 Document Revised: 04/03/2016 Document Reviewed: 12/01/2014 Elsevier Interactive Patient Education  2017 [employer].

## 2022-09-04 ENCOUNTER — Encounter: Payer: Self-pay | Admitting: Family Medicine

## 2022-09-04 ENCOUNTER — Ambulatory Visit (INDEPENDENT_AMBULATORY_CARE_PROVIDER_SITE_OTHER): Payer: Medicare Other | Admitting: Family Medicine

## 2022-09-04 VITALS — BP 138/91 | HR 86 | Temp 98.7°F | Ht 64.0 in | Wt 198.0 lb

## 2022-09-04 DIAGNOSIS — I10 Essential (primary) hypertension: Secondary | ICD-10-CM

## 2022-09-04 DIAGNOSIS — E782 Mixed hyperlipidemia: Secondary | ICD-10-CM

## 2022-09-04 MED ORDER — LOSARTAN POTASSIUM-HCTZ 50-12.5 MG PO TABS: 1.0000 | ORAL_TABLET | Freq: Every day | ORAL | 3 refills | Status: DC

## 2022-09-04 NOTE — Progress Notes (Signed)
BP (!) 138/91   Pulse 86   Temp 98.7 F (37.1 C)   Ht _0  (1.626 m)   Wt 198 lb (89.8 kg)   SpO2 98%   BMI 33.99 kg/m    Subjective:   Patient ID: Beth Bryant, female    DOB: 17-Dec-1945, 76 y.o.   MRN: [id number]  HPI: Beth Bryant is a 76 y.o. female presenting on 09/04/2022 for Medical Management of Chronic Issues (6 month - pt states everything is going well , no concerns - declines all HM today ) and Hypertension (Pt states she has been good - she has been eating better )   HPI Hypertension Patient is currently on losartan-hctz, and their blood pressure today is 138/91. Patient denies any lightheadedness or dizziness. Patient denies headaches, blurred vision, chest pains, shortness of breath, or weakness. Denies any side effects from medication and is content with current medication.   Hyperlipidemia Patient is coming in for recheck of his hyperlipidemia. The patient is currently taking fish oils. They deny any issues with myalgias or history of liver damage from it. They deny any focal numbness or weakness or chest pain.   Relevant past medical, surgical, family and social history reviewed and updated as indicated. Interim medical history since our last visit reviewed. Allergies and medications reviewed and updated.  Review of Systems  Constitutional:  Negative for chills and fever.  Eyes:  Negative for visual disturbance.  Respiratory:  Negative for chest tightness and shortness of breath.   Cardiovascular:  Negative for chest pain and leg swelling.  Musculoskeletal:  Negative for back pain and gait problem.  Skin:  Negative for rash.  Neurological:  Negative for light-headedness and headaches.  Psychiatric/Behavioral:  Negative for agitation and behavioral problems.   All other systems reviewed and are negative.   Per HPI unless specifically indicated above   Allergies as of 09/04/2022   No Known Allergies      Medication List         Accurate as of September 04, 2022  2:48 PM. If you have any questions, ask your nurse or doctor.          Collagen 1500/C 500-50-0.8 MG Caps Generic drug: Collagen-Vitamin C-Biotin Take by mouth daily.   D3-1000 PO Take by mouth daily.   ENZYME DIGEST PO Take by mouth daily.   Fish Oil 1000 MG Caps Take by mouth.   K2-45 45 MCG Caps Generic drug: Menaquinone-7 Take by mouth daily.   losartan-hydrochlorothiazide 50-12.5 MG tablet Commonly known as: HYZAAR Take 1 tablet by mouth daily.   multivitamin capsule Take 1 capsule by mouth daily.   PROBIOTIC-10 PO Take by mouth daily.   vitamin C 100 MG tablet Take 100 mg by mouth daily.         Objective:   BP (!) 138/91   Pulse 86   Temp 98.7 F (37.1 C)   Ht _1  (1.626 m)   Wt 198 lb (89.8 kg)   SpO2 98%   BMI 33.99 kg/m   Wt Readings from Last 3 Encounters:  09/04/22 198 lb (89.8 kg)  04/09/22 194 lb (88 kg)  02/28/22 196 lb (88.9 kg)    Physical Exam Vitals and nursing note reviewed.  Constitutional:      General: She is not in acute distress.    Appearance: She is well-developed. She is not diaphoretic.  Eyes:     Conjunctiva/sclera: Conjunctivae normal.  Cardiovascular:     Rate and Rhythm:  Normal rate and regular rhythm.     Heart sounds: Normal heart sounds. No murmur heard. Pulmonary:     Effort: Pulmonary effort is normal. No respiratory distress.     Breath sounds: Normal breath sounds. No wheezing.  Musculoskeletal:        General: No swelling or tenderness. Normal range of motion.  Skin:    General: Skin is warm and dry.     Findings: No rash.  Neurological:     Mental Status: She is alert and oriented to person, place, and time.     Coordination: Coordination normal.  Psychiatric:        Behavior: Behavior normal.       Assessment & Plan:   Problem List Items Addressed This Visit       Cardiovascular and Mediastinum   Essential hypertension   Relevant Medications    losartan-hydrochlorothiazide (HYZAAR) 50-12.5 MG tablet   Other Relevant Orders   CBC with Differential/Platelet   CMP14+EGFR     Other   Hyperlipidemia - Primary   Relevant Medications   losartan-hydrochlorothiazide (HYZAAR) 50-12.5 MG tablet   Other Relevant Orders   Lipid panel   CBC with Differential/Platelet   CMP14+EGFR  Continue current medicine, no changes.  Patient refuses all health maintenance  Follow up plan: Return in about 6 months (around 03/06/2023), or if symptoms worsen or fail to improve, for htn and hld.  Counseling provided for all of the vaccine components Orders Placed This Encounter  Procedures   Lipid panel   CBC with Differential/Platelet   Washington Mckade Gurka, MD [facility] 09/04/2022, 2:48 PM

## 2022-09-05 LAB — CMP14+EGFR
ALT: 32 IU/L (ref 0–32)
AST: 42 IU/L — ABNORMAL HIGH (ref 0–40)
Albumin/Globulin Ratio: 1.3 (ref 1.2–2.2)
Albumin: 4.5 g/dL (ref 3.8–4.8)
Alkaline Phosphatase: 93 IU/L (ref 44–121)
BUN/Creatinine Ratio: 20 (ref 12–28)
BUN: 20 mg/dL (ref 8–27)
Bilirubin Total: 0.5 mg/dL (ref 0.0–1.2)
CO2: 27 mmol/L (ref 20–29)
Calcium: 10 mg/dL (ref 8.7–10.3)
Chloride: 97 mmol/L (ref 96–106)
Creatinine, Ser: 0.99 mg/dL (ref 0.57–1.00)
Globulin, Total: 3.4 g/dL (ref 1.5–4.5)
Glucose: 93 mg/dL (ref 70–99)
Potassium: 4.9 mmol/L (ref 3.5–5.2)
Sodium: 138 mmol/L (ref 134–144)
Total Protein: 7.9 g/dL (ref 6.0–8.5)
eGFR: 60 mL/min/{1.73_m2} (ref >59)

## 2022-09-05 LAB — CBC WITH DIFFERENTIAL/PLATELET
Basophils Absolute: 0 10*3/uL (ref 0.0–0.2)
Basos: 0 %
EOS (ABSOLUTE): 0.2 10*3/uL (ref 0.0–0.4)
Eos: 2 %
Hematocrit: 42.3 % (ref 34.0–46.6)
Hemoglobin: 14.2 g/dL (ref 11.1–15.9)
Immature Grans (Abs): 0 10*3/uL (ref 0.0–0.1)
Immature Granulocytes: 0 %
Lymphocytes Absolute: 2.4 10*3/uL (ref 0.7–3.1)
Lymphs: 33 %
MCH: 32.1 pg (ref 26.6–33.0)
MCHC: 33.6 g/dL (ref 31.5–35.7)
MCV: 96 fL (ref 79–97)
Monocytes Absolute: 0.6 10*3/uL (ref 0.1–0.9)
Monocytes: 9 %
Neutrophils Absolute: 4 10*3/uL (ref 1.4–7.0)
Neutrophils: 56 %
Platelets: 164 10*3/uL (ref 150–450)
RBC: 4.43 x10E6/uL (ref 3.77–5.28)
RDW: 12.5 % (ref 11.7–15.4)
WBC: 7.2 10*3/uL (ref 3.4–10.8)

## 2022-09-05 LAB — LIPID PANEL
Chol/HDL Ratio: 2.9 ratio (ref 0.0–4.4)
Cholesterol, Total: 212 mg/dL — ABNORMAL HIGH (ref 100–199)
HDL: 74 mg/dL (ref >39)
LDL Chol Calc (NIH): 111 mg/dL — ABNORMAL HIGH (ref 0–99)
Triglycerides: 158 mg/dL — ABNORMAL HIGH (ref 0–149)
VLDL Cholesterol Cal: 27 mg/dL (ref 5–40)

## 2022-09-17 NOTE — Progress Notes (Signed)
Patient returning call. Please call back

## 2023-01-06 ENCOUNTER — Encounter: Payer: Self-pay | Admitting: Family Medicine

## 2023-03-06 ENCOUNTER — Ambulatory Visit (INDEPENDENT_AMBULATORY_CARE_PROVIDER_SITE_OTHER): Payer: Medicare Other | Admitting: Family Medicine

## 2023-03-06 ENCOUNTER — Encounter: Payer: Self-pay | Admitting: Family Medicine

## 2023-03-06 VITALS — BP 131/79 | HR 88 | Ht 64.0 in | Wt 201.0 lb

## 2023-03-06 DIAGNOSIS — R7989 Other specified abnormal findings of blood chemistry: Secondary | ICD-10-CM

## 2023-03-06 DIAGNOSIS — I10 Essential (primary) hypertension: Secondary | ICD-10-CM

## 2023-03-06 DIAGNOSIS — E782 Mixed hyperlipidemia: Secondary | ICD-10-CM

## 2023-03-06 NOTE — Progress Notes (Signed)
BP 131/79   Pulse 88   Ht 5\' 4"  (1.626 m)   Wt 201 lb (91.2 kg)   SpO2 96%   BMI 34.50 kg/m    Subjective:   Patient ID: Beth Bryant, female    DOB: 1946/04/06, 77 y.o.   MRN: [id number]  HPI: Beth Bryant is a 77 y.o. female presenting on 03/06/2023 for Medical Management of Chronic Issues (6 month)   HPI Hypertension Patient is currently on losartan hydrochlorothiazide, and their blood pressure today is 144/93 and 131/80. Patient denies any lightheadedness or dizziness. Patient denies headaches, blurred vision, chest pains, shortness of breath, or weakness. Denies any side effects from medication and is content with current medication.   Hyperlipidemia Patient is coming in for recheck of his hyperlipidemia. The patient is currently taking fish oil. They deny any issues with myalgias or history of liver damage from it. They deny any focal numbness or weakness or chest pain.   Elevated liver function Patient has had slightly elevated liver function in the past couple times, will recheck with blood work and monitor.  She denies any symptoms.  Relevant past medical, surgical, family and social history reviewed and updated as indicated. Interim medical history since our last visit reviewed. Allergies and medications reviewed and updated.  Review of Systems  Constitutional:  Negative for chills and fever.  Eyes:  Negative for visual disturbance.  Respiratory:  Negative for chest tightness and shortness of breath.   Cardiovascular:  Negative for chest pain and leg swelling.  Genitourinary:  Negative for difficulty urinating and dysuria.  Musculoskeletal:  Negative for back pain and gait problem.  Skin:  Negative for rash.  Neurological:  Negative for dizziness, light-headedness and headaches.  Psychiatric/Behavioral:  Negative for agitation and behavioral problems.   All other systems reviewed and are negative.   Per HPI unless specifically indicated  above   Allergies as of 03/06/2023   No Known Allergies      Medication List        Accurate as of March 06, 2023  2:27 PM. If you have any questions, ask your nurse or doctor.          Collagen 1500/C 500-50-0.8 MG Caps Generic drug: Collagen-Vitamin C-Biotin Take by mouth daily.   D3-1000 PO Take by mouth daily.   ENZYME DIGEST PO Take by mouth daily.   Fish Oil 1000 MG Caps Take by mouth.   K2-45 45 MCG Caps Generic drug: Menaquinone-7 Take by mouth daily.   losartan-hydrochlorothiazide 50-12.5 MG tablet Commonly known as: HYZAAR Take 1 tablet by mouth daily.   multivitamin capsule Take 1 capsule by mouth daily.   PROBIOTIC-10 PO Take by mouth daily.   vitamin C 100 MG tablet Take 100 mg by mouth daily.         Objective:   BP 131/79   Pulse 88   Ht 5\' 4"  (1.626 m)   Wt 201 lb (91.2 kg)   SpO2 96%   BMI 34.50 kg/m   Wt Readings from Last 3 Encounters:  03/06/23 201 lb (91.2 kg)  09/04/22 198 lb (89.8 kg)  04/09/22 194 lb (88 kg)    Physical Exam Vitals and nursing note reviewed.  Constitutional:      General: She is not in acute distress.    Appearance: She is well-developed. She is not diaphoretic.  Eyes:     Conjunctiva/sclera: Conjunctivae normal.  Cardiovascular:     Rate and Rhythm: Normal rate and regular rhythm.  Heart sounds: Normal heart sounds. No murmur heard. Pulmonary:     Effort: Pulmonary effort is normal. No respiratory distress.     Breath sounds: Normal breath sounds. No wheezing.  Musculoskeletal:        General: No swelling or tenderness. Normal range of motion.  Skin:    General: Skin is warm and dry.     Findings: No rash.  Neurological:     Mental Status: She is alert and oriented to person, place, and time.     Coordination: Coordination normal.  Psychiatric:        Behavior: Behavior normal.       Assessment & Plan:   Problem List Items Addressed This Visit       Cardiovascular and  Mediastinum   Essential hypertension - Primary   Relevant Orders   CBC with Differential/Platelet   CMP14+EGFR     Other   Hyperlipidemia   Relevant Orders   Lipid panel   Elevated liver function tests   Relevant Orders   CBC with Differential/Platelet   CMP14+EGFR    Continue current medicine, will check blood work today.  Blood pressure was better on second check. Follow up plan: Return in about 6 months (around 09/05/2023), or if symptoms worsen or fail to improve, for Hypertension and hyperlipidemia.  Counseling provided for all of the vaccine components Orders Placed This Encounter  Procedures   CBC with Differential/Platelet   CMP14+EGFR   Lipid panel    Arville Care, MD [facility] 03/06/2023, 2:27 PM

## 2023-03-07 LAB — CMP14+EGFR
ALT: 42 IU/L — ABNORMAL HIGH (ref 0–32)
AST: 45 IU/L — ABNORMAL HIGH (ref 0–40)
Albumin/Globulin Ratio: 1.4 (ref 1.2–2.2)
Albumin: 4.5 g/dL (ref 3.8–4.8)
Alkaline Phosphatase: 67 IU/L (ref 44–121)
BUN/Creatinine Ratio: 23 (ref 12–28)
BUN: 19 mg/dL (ref 8–27)
Bilirubin Total: 0.6 mg/dL (ref 0.0–1.2)
CO2: 23 mmol/L (ref 20–29)
Calcium: 10.3 mg/dL (ref 8.7–10.3)
Chloride: 98 mmol/L (ref 96–106)
Creatinine, Ser: 0.81 mg/dL (ref 0.57–1.00)
Globulin, Total: 3.2 g/dL (ref 1.5–4.5)
Glucose: 83 mg/dL (ref 70–99)
Potassium: 4.6 mmol/L (ref 3.5–5.2)
Sodium: 139 mmol/L (ref 134–144)
Total Protein: 7.7 g/dL (ref 6.0–8.5)
eGFR: 76 mL/min/{1.73_m2} (ref >59)

## 2023-03-07 LAB — CBC WITH DIFFERENTIAL/PLATELET
Basophils Absolute: 0.1 10*3/uL (ref 0.0–0.2)
Basos: 1 %
EOS (ABSOLUTE): 0.2 10*3/uL (ref 0.0–0.4)
Eos: 2 %
Hematocrit: 40.6 % (ref 34.0–46.6)
Hemoglobin: 14 g/dL (ref 11.1–15.9)
Immature Grans (Abs): 0 10*3/uL (ref 0.0–0.1)
Immature Granulocytes: 0 %
Lymphocytes Absolute: 2.6 10*3/uL (ref 0.7–3.1)
Lymphs: 35 %
MCH: 32.9 pg (ref 26.6–33.0)
MCHC: 34.5 g/dL (ref 31.5–35.7)
MCV: 96 fL (ref 79–97)
Monocytes Absolute: 0.6 10*3/uL (ref 0.1–0.9)
Monocytes: 8 %
Neutrophils Absolute: 4 10*3/uL (ref 1.4–7.0)
Neutrophils: 54 %
Platelets: 163 10*3/uL (ref 150–450)
RBC: 4.25 x10E6/uL (ref 3.77–5.28)
RDW: 12.9 % (ref 11.7–15.4)
WBC: 7.4 10*3/uL (ref 3.4–10.8)

## 2023-03-07 LAB — LIPID PANEL
Chol/HDL Ratio: 2.4 ratio (ref 0.0–4.4)
Cholesterol, Total: 209 mg/dL — ABNORMAL HIGH (ref 100–199)
HDL: 86 mg/dL (ref >39)
LDL Chol Calc (NIH): 100 mg/dL — ABNORMAL HIGH (ref 0–99)
Triglycerides: 137 mg/dL (ref 0–149)
VLDL Cholesterol Cal: 23 mg/dL (ref 5–40)

## 2023-09-07 ENCOUNTER — Ambulatory Visit (INDEPENDENT_AMBULATORY_CARE_PROVIDER_SITE_OTHER): Payer: Medicare Other | Admitting: Family Medicine

## 2023-09-07 ENCOUNTER — Encounter: Payer: Self-pay | Admitting: Family Medicine

## 2023-09-07 VITALS — BP 125/81 | HR 87 | Ht 64.0 in | Wt 197.0 lb

## 2023-09-07 DIAGNOSIS — I10 Essential (primary) hypertension: Secondary | ICD-10-CM | POA: Diagnosis not present

## 2023-09-07 DIAGNOSIS — E782 Mixed hyperlipidemia: Secondary | ICD-10-CM

## 2023-09-07 MED ORDER — LOSARTAN POTASSIUM-HCTZ 50-12.5 MG PO TABS: 1.0000 | ORAL_TABLET | Freq: Every day | ORAL | 3 refills | Status: DC

## 2023-09-07 NOTE — Progress Notes (Signed)
BP 125/81   Pulse 87   Ht 5\' 4"  (1.626 m)   Wt 197 lb (89.4 kg)   SpO2 96%   BMI 33.81 kg/m    Subjective:   Patient ID: Beth Bryant, female    DOB: 05/19/1946, 77 y.o.   MRN: [id number]  HPI: Beth Bryant is a 77 y.o. female presenting on 09/07/2023 for Medical Management of Chronic Issues (CPE)   HPI Hypertension Patient is currently on losartan hydrochlorothiazide, and their blood pressure today is 125/81. Patient denies any lightheadedness or dizziness. Patient denies headaches, blurred vision, chest pains, shortness of breath, or weakness. Denies any side effects from medication and is content with current medication.   Hyperlipidemia Patient is coming in for recheck of his hyperlipidemia. The patient is currently taking fish oils. They deny any issues with myalgias or history of liver damage from it. They deny any focal numbness or weakness or chest pain.   Patient has been having a little bit of left-sided sciatic nerve pain and is seen a chiropractor for it and they are helping somewhat with it.  She is also using some topical Biofreeze and other muscle rubs and they help a little bit.   Relevant past medical, surgical, family and social history reviewed and updated as indicated. Interim medical history since our last visit reviewed. Allergies and medications reviewed and updated.  Review of Systems  Constitutional:  Negative for chills and fever.  HENT:  Negative for congestion, ear discharge and ear pain.   Eyes:  Negative for redness and visual disturbance.  Respiratory:  Negative for chest tightness and shortness of breath.   Cardiovascular:  Positive for leg swelling. Negative for chest pain.  Genitourinary:  Negative for difficulty urinating and dysuria.  Musculoskeletal:  Negative for back pain and gait problem.  Skin:  Negative for rash.  Neurological:  Negative for light-headedness and headaches.  Psychiatric/Behavioral:  Negative for agitation  and behavioral problems.   All other systems reviewed and are negative.   Per HPI unless specifically indicated above   Allergies as of 09/07/2023   No Known Allergies      Medication List        Accurate as of September 07, 2023  2:26 PM. If you have any questions, ask your nurse or doctor.          Collagen 1500/C 500-50-0.8 MG Caps Generic drug: Collagen-Vitamin C-Biotin Take by mouth daily.   D3-1000 PO Take by mouth daily.   ENZYME DIGEST PO Take by mouth daily.   Fish Oil 1000 MG Caps Take by mouth.   K2-45 45 MCG Caps Generic drug: Menaquinone-7 Take by mouth daily.   losartan-hydrochlorothiazide 50-12.5 MG tablet Commonly known as: HYZAAR Take 1 tablet by mouth daily.   multivitamin capsule Take 1 capsule by mouth daily.   PROBIOTIC-10 PO Take by mouth daily.   vitamin C 100 MG tablet Take 100 mg by mouth daily.         Objective:   BP 125/81   Pulse 87   Ht 5\' 4"  (1.626 m)   Wt 197 lb (89.4 kg)   SpO2 96%   BMI 33.81 kg/m   Wt Readings from Last 3 Encounters:  09/07/23 197 lb (89.4 kg)  03/06/23 201 lb (91.2 kg)  09/04/22 198 lb (89.8 kg)    Physical Exam Vitals and nursing note reviewed.  Constitutional:      General: She is not in acute distress.    Appearance: She is  well-developed. She is not diaphoretic.  Eyes:     Conjunctiva/sclera: Conjunctivae normal.  Cardiovascular:     Rate and Rhythm: Normal rate and regular rhythm.     Heart sounds: Normal heart sounds. No murmur heard. Pulmonary:     Effort: Pulmonary effort is normal. No respiratory distress.     Breath sounds: Normal breath sounds. No wheezing.  Musculoskeletal:        General: Swelling (Trace) present. No tenderness. Normal range of motion.  Skin:    General: Skin is warm and dry.     Findings: No rash.  Neurological:     Mental Status: She is alert and oriented to person, place, and time.     Coordination: Coordination normal.  Psychiatric:         Behavior: Behavior normal.       Assessment & Plan:   Problem List Items Addressed This Visit       Cardiovascular and Mediastinum   Essential hypertension - Primary   Relevant Medications   losartan-hydrochlorothiazide (HYZAAR) 50-12.5 MG tablet   Other Relevant Orders   CBC with Differential/Platelet   CMP14+EGFR   Lipid panel     Other   Hyperlipidemia   Relevant Medications   losartan-hydrochlorothiazide (HYZAAR) 50-12.5 MG tablet   Other Relevant Orders   CBC with Differential/Platelet   CMP14+EGFR   Lipid panel    Continue current medicine, will check blood work today.  Seem to be doing well except for a little bit of sciatic nerve pain and are managing that with chiropractor and topical agents.  Also recommended she could use topical Voltaren gel as well. Follow up plan: Return in about 6 months (around 03/07/2024), or if symptoms worsen or fail to improve, for Hypertension recheck.  Counseling provided for all of the vaccine components Orders Placed This Encounter  Procedures   CBC with Differential/Platelet   CMP14+EGFR   Lipid panel    Arville Care, MD [facility] 09/07/2023, 2:26 PM

## 2023-09-08 LAB — CMP14+EGFR
ALT: 43 [IU]/L — ABNORMAL HIGH (ref 0–32)
AST: 41 [IU]/L — ABNORMAL HIGH (ref 0–40)
Albumin: 4.3 g/dL (ref 3.8–4.8)
Alkaline Phosphatase: 76 [IU]/L (ref 44–121)
BUN/Creatinine Ratio: 21 (ref 12–28)
BUN: 19 mg/dL (ref 8–27)
Bilirubin Total: 0.6 mg/dL (ref 0.0–1.2)
CO2: 24 mmol/L (ref 20–29)
Calcium: 10 mg/dL (ref 8.7–10.3)
Chloride: 99 mmol/L (ref 96–106)
Creatinine, Ser: 0.89 mg/dL (ref 0.57–1.00)
Globulin, Total: 3.4 g/dL (ref 1.5–4.5)
Glucose: 89 mg/dL (ref 70–99)
Potassium: 4.5 mmol/L (ref 3.5–5.2)
Sodium: 138 mmol/L (ref 134–144)
Total Protein: 7.7 g/dL (ref 6.0–8.5)
eGFR: 68 mL/min/{1.73_m2} (ref >59)

## 2023-09-08 LAB — CBC WITH DIFFERENTIAL/PLATELET
Basophils Absolute: 0 10*3/uL (ref 0.0–0.2)
Basos: 0 %
EOS (ABSOLUTE): 0.2 10*3/uL (ref 0.0–0.4)
Eos: 2 %
Hematocrit: 42.7 % (ref 34.0–46.6)
Hemoglobin: 14.1 g/dL (ref 11.1–15.9)
Immature Grans (Abs): 0 10*3/uL (ref 0.0–0.1)
Immature Granulocytes: 0 %
Lymphocytes Absolute: 2.6 10*3/uL (ref 0.7–3.1)
Lymphs: 32 %
MCH: 32 pg (ref 26.6–33.0)
MCHC: 33 g/dL (ref 31.5–35.7)
MCV: 97 fL (ref 79–97)
Monocytes Absolute: 0.7 10*3/uL (ref 0.1–0.9)
Monocytes: 9 %
Neutrophils Absolute: 4.5 10*3/uL (ref 1.4–7.0)
Neutrophils: 57 %
Platelets: 179 10*3/uL (ref 150–450)
RBC: 4.4 x10E6/uL (ref 3.77–5.28)
RDW: 12.9 % (ref 11.7–15.4)
WBC: 8.1 10*3/uL (ref 3.4–10.8)

## 2023-09-08 LAB — LIPID PANEL
Chol/HDL Ratio: 2.4 ratio (ref 0.0–4.4)
Cholesterol, Total: 204 mg/dL — ABNORMAL HIGH (ref 100–199)
HDL: 84 mg/dL (ref >39)
LDL Chol Calc (NIH): 101 mg/dL — ABNORMAL HIGH (ref 0–99)
Triglycerides: 111 mg/dL (ref 0–149)
VLDL Cholesterol Cal: 19 mg/dL (ref 5–40)

## 2024-02-15 ENCOUNTER — Ambulatory Visit (INDEPENDENT_AMBULATORY_CARE_PROVIDER_SITE_OTHER): Admitting: Nurse Practitioner

## 2024-02-15 ENCOUNTER — Encounter: Payer: Self-pay | Admitting: Nurse Practitioner

## 2024-02-15 VITALS — BP 143/85 | HR 106 | Temp 98.1°F | Ht 64.0 in | Wt 191.0 lb

## 2024-02-15 DIAGNOSIS — J40 Bronchitis, not specified as acute or chronic: Secondary | ICD-10-CM

## 2024-02-15 MED ORDER — PREDNISONE 20 MG PO TABS: 40.0000 mg | ORAL_TABLET | Freq: Every day | ORAL | 0 refills | Status: AC

## 2024-02-15 MED ORDER — HYDROCODONE BIT-HOMATROP MBR 5-1.5 MG/5ML PO SOLN: 5.0000 mL | Freq: Every day | ORAL | 0 refills | Status: DC

## 2024-02-15 MED ORDER — AZITHROMYCIN 250 MG PO TABS: ORAL_TABLET | ORAL | 0 refills | Status: DC

## 2024-02-15 NOTE — Progress Notes (Signed)
 Subjective:    Patient ID: Beth Bryant, female    DOB: 1946/05/27, 78 y.o.   MRN: [id number]   Chief Complaint: Cough (Phlegm with blood)   Cough This is a new problem. The current episode started in the past 7 days. The problem has been waxing and waning. The problem occurs every few minutes. The cough is Productive of sputum. Associated symptoms include a fever (had fever o n saturday but not since), nasal congestion and rhinorrhea. The symptoms are aggravated by lying down. Treatments tried: corcidin. The treatment provided mild relief.    Patient Active Problem List   Diagnosis Date Noted   Hyperlipidemia 03/07/2020   Elevated liver function tests 03/07/2020   Essential hypertension 02/15/2016       Review of Systems  Constitutional:  Positive for fever (had fever o n saturday but not since).  HENT:  Positive for rhinorrhea.   Respiratory:  Positive for cough.        Objective:   Physical Exam Constitutional:      Appearance: Normal appearance. She is obese.  Cardiovascular:     Rate and Rhythm: Normal rate and regular rhythm.     Heart sounds: Normal heart sounds.  Pulmonary:     Breath sounds: Wheezing (insp and exp) present. No rhonchi.  Skin:    General: Skin is warm.  Neurological:     General: No focal deficit present.     Mental Status: She is alert and oriented to person, place, and time.  Psychiatric:        Mood and Affect: Mood normal.        Behavior: Behavior normal.    BP (!) 143/85   Pulse (!) 106   Temp 98.1 F (36.7 C) (Temporal)   Ht 5\' 4"  (1.626 m)   Wt 191 lb (86.6 kg)   BMI 32.79 kg/m         Assessment & Plan:   Beth Bryant in today with chief complaint of Cough (Phlegm with blood)   1. Bronchitis (Primary) 1. Take meds as prescribed 2. Use a cool mist humidifier especially during the winter months and when heat has been humid. 3. Use saline nose sprays frequently 4. Saline irrigations of the nose can be  very helpful if done frequently.  * 4X daily for 1 week*  * Use of a nettie pot can be helpful with this. Follow directions with this* 5. Drink plenty of fluids 6. Keep thermostat turn down low 7.For any cough or congestion- mucinex during the day 8. For fever or aces or pains- take tylenol or ibuprofen appropriate for age and weight.  * for fevers greater than 101 orally you may alternate ibuprofen and tylenol every  3 hours.    Meds ordered this encounter  Medications   HYDROcodone bit-homatropine (HYCODAN) 5-1.5 MG/5ML syrup    Sig: Take 5 mLs by mouth at bedtime.    Dispense:  120 mL    Refill:  0    Supervising Provider:   Arville Care A [[id number]]   azithromycin (ZITHROMAX Z-PAK) 250 MG tablet    Sig: As directed    Dispense:  6 tablet    Refill:  0    Supervising Provider:   Arville Care A [[id number]]   predniSONE (DELTASONE) 20 MG tablet    Sig: Take 2 tablets (40 mg total) by mouth daily with breakfast for 5 days. 2 po daily for 5 days    Dispense:  10 tablet  Refill:  0    Supervising Provider:   Arville Care A [[id number]]      The above assessment and management plan was discussed with the patient. The patient verbalized understanding of and has agreed to the management plan. Patient is aware to call the clinic if symptoms persist or worsen. Patient is aware when to return to the clinic for a follow-up visit. Patient educated on when it is appropriate to go to the emergency department.   Mary-Margaret Daphine Deutscher, FNP

## 2024-02-15 NOTE — Patient Instructions (Signed)
1. Take meds as prescribed ?2. Use a cool mist humidifier especially during the winter months and when heat has been humid. ?3. Use saline nose sprays frequently ?4. Saline irrigations of the nose can be very helpful if done frequently. ? * 4X daily for 1 week* ? * Use of a nettie pot can be helpful with this. Follow directions with this* ?5. Drink plenty of fluids ?6. Keep thermostat turn down low ?7.For any cough or congestion- mucinex plain ?8. For fever or aces or pains- take tylenol or ibuprofen appropriate for age and weight. ? * for fevers greater than 101 orally you may alternate ibuprofen and tylenol every  3 hours. ?  ? ?

## 2024-03-09 ENCOUNTER — Encounter: Payer: Self-pay | Admitting: Family Medicine

## 2024-03-09 ENCOUNTER — Ambulatory Visit (INDEPENDENT_AMBULATORY_CARE_PROVIDER_SITE_OTHER): Payer: Medicare Other | Admitting: Family Medicine

## 2024-03-09 VITALS — BP 134/87 | HR 95 | Ht 64.0 in | Wt 192.0 lb

## 2024-03-09 DIAGNOSIS — E782 Mixed hyperlipidemia: Secondary | ICD-10-CM

## 2024-03-09 DIAGNOSIS — I10 Essential (primary) hypertension: Secondary | ICD-10-CM | POA: Diagnosis not present

## 2024-03-09 DIAGNOSIS — F5102 Adjustment insomnia: Secondary | ICD-10-CM

## 2024-03-09 LAB — LIPID PANEL

## 2024-03-09 MED ORDER — TRAZODONE HCL 50 MG PO TABS: 25.0000 mg | ORAL_TABLET | Freq: Every evening | ORAL | 3 refills | Status: DC | PRN

## 2024-03-09 NOTE — Progress Notes (Signed)
 BP 134/87   Pulse 95   Ht 5\' 4"  (1.626 m)   Wt 192 lb (87.1 kg)   SpO2 95%   BMI 32.96 kg/m    Subjective:   Patient ID: Beth Bryant, female    DOB: May 29, 1946, 78 y.o.   MRN: [id number]  HPI: Beth Bryant is a 78 y.o. female presenting on 03/09/2024 for Medical Management of Chronic Issues, Hypertension, Hyperlipidemia, and Anxiety (Feels nervous at night)   HPI Hypertension Patient is currently on losartan -hydrochlorothiazide , and their blood pressure today is 134/87. Patient denies any lightheadedness or dizziness. Patient denies headaches, blurred vision, chest pains, shortness of breath, or weakness. Denies any side effects from medication and is content with current medication.   Hyperlipidemia Patient is coming in for recheck of his hyperlipidemia. The patient is currently taking fish oils. They deny any issues with myalgias or history of liver damage from it. They deny any focal numbness or weakness or chest pain.   Anxiety Patient says she is getting more anxiety at night especially since she has been getting over this bronchitis.  She was congested and having trouble breathing at night and now it is throwing off her sleep pattern and her mind is racing and concerned about the illness.  She feels like if she can just get some good night sleep that will help her feel better and recover.    02/15/2024   11:34 AM 03/06/2023    1:56 PM 09/04/2022    2:11 PM 04/09/2022    8:22 AM 02/28/2022    1:21 PM  Depression screen PHQ 2/9  Decreased Interest 0 0 0 0 0  Down, Depressed, Hopeless 0 0 0 0 0  PHQ - 2 Score 0 0 0 0 0  Altered sleeping  0 0 0 0  Tired, decreased energy  0 0 0 0  Change in appetite  0 0 0 0  Feeling bad or failure about yourself   0 0 0 0  Trouble concentrating  0 0 0 0  Moving slowly or fidgety/restless  0 0 0 0  Suicidal thoughts  0 0 0 0  PHQ-9 Score  0 0 0 0  Difficult doing work/chores  Not difficult at all Not difficult at all        Relevant past medical, surgical, family and social history reviewed and updated as indicated. Interim medical history since our last visit reviewed. Allergies and medications reviewed and updated.  Review of Systems  Constitutional:  Negative for chills and fever.  HENT:  Positive for congestion. Negative for ear discharge and ear pain.   Eyes:  Negative for redness and visual disturbance.  Respiratory:  Negative for chest tightness and shortness of breath.   Cardiovascular:  Negative for chest pain and leg swelling.  Genitourinary:  Negative for difficulty urinating and dysuria.  Musculoskeletal:  Negative for back pain and gait problem.  Skin:  Negative for rash.  Neurological:  Negative for dizziness, light-headedness and headaches.  Psychiatric/Behavioral:  Positive for sleep disturbance. Negative for agitation and behavioral problems. The patient is nervous/anxious.   All other systems reviewed and are negative.   Per HPI unless specifically indicated above   Allergies as of 03/09/2024   No Known Allergies      Medication List        Accurate as of March 09, 2024  2:09 PM. If you have any questions, ask your nurse or doctor.          STOP  taking these medications    azithromycin  250 MG tablet Commonly known as: Zithromax  Z-Pak Stopped by: Lucio Sabin Lokelani Lutes   HYDROcodone  bit-homatropine 5-1.5 MG/5ML syrup Commonly known as: HYCODAN Stopped by: Lucio Sabin Madsen Riddle       TAKE these medications    Collagen 1500/C 500-50-0.8 MG Caps Generic drug: Collagen-Vitamin C-Biotin Take by mouth daily.   D3-1000 PO Take by mouth daily.   ENZYME DIGEST PO Take by mouth daily.   Fish Oil 1000 MG Caps Take by mouth.   K2-45 45 MCG Caps Generic drug: Menaquinone-7 Take by mouth daily.   losartan -hydrochlorothiazide  50-12.5 MG tablet Commonly known as: HYZAAR Take 1 tablet by mouth daily.   multivitamin capsule Take 1 capsule by mouth daily.    PROBIOTIC-10 PO Take by mouth daily.   traZODone 50 MG tablet Commonly known as: DESYREL Take 0.5-1 tablets (25-50 mg total) by mouth at bedtime as needed for sleep. Started by: Lucio Sabin Shemika Robbs   vitamin C 100 MG tablet Take 100 mg by mouth daily.         Objective:   BP 134/87   Pulse 95   Ht 5\' 4"  (1.626 m)   Wt 192 lb (87.1 kg)   SpO2 95%   BMI 32.96 kg/m   Wt Readings from Last 3 Encounters:  03/09/24 192 lb (87.1 kg)  02/15/24 191 lb (86.6 kg)  09/07/23 197 lb (89.4 kg)    Physical Exam Vitals and nursing note reviewed.  Constitutional:      General: She is not in acute distress.    Appearance: She is well-developed. She is not diaphoretic.  Eyes:     Conjunctiva/sclera: Conjunctivae normal.  Cardiovascular:     Rate and Rhythm: Normal rate and regular rhythm.     Heart sounds: Normal heart sounds. No murmur heard. Pulmonary:     Effort: Pulmonary effort is normal. No respiratory distress.     Breath sounds: Normal breath sounds. No wheezing.  Musculoskeletal:        General: No tenderness. Normal range of motion.  Skin:    General: Skin is warm and dry.     Findings: No rash.  Neurological:     Mental Status: She is alert and oriented to person, place, and time.     Coordination: Coordination normal.  Psychiatric:        Behavior: Behavior normal.       Assessment & Plan:   Problem List Items Addressed This Visit       Cardiovascular and Mediastinum   Essential hypertension   Relevant Orders   CBC with Differential/Platelet   CMP14+EGFR   Lipid panel     Other   Hyperlipidemia - Primary   Relevant Orders   CBC with Differential/Platelet   CMP14+EGFR   Lipid panel   Other Visit Diagnoses       Insomnia due to psychological stress       Relevant Medications   traZODone (DESYREL) 50 MG tablet       Will start trazodone to help with insomnia and anxiety, she can take it as needed.  Blood pressure looks good today, will  check blood work as well.  Patient refused most health maintenance including vaccines and scans. Follow up plan: Return in about 6 months (around 09/08/2024), or if symptoms worsen or fail to improve, for Hypertension and hyperlipidemia.  Counseling provided for all of the vaccine components Orders Placed This Encounter  Procedures   CBC with Differential/Platelet  GEX52+WUXL   Lipid panel    Jolyne Needs, MD [facility] 03/09/2024, 2:09 PM

## 2024-03-10 LAB — CBC WITH DIFFERENTIAL/PLATELET
Basophils Absolute: 0 10*3/uL (ref 0.0–0.2)
Basos: 0 %
EOS (ABSOLUTE): 0.2 10*3/uL (ref 0.0–0.4)
Eos: 3 %
Hematocrit: 41.1 % (ref 34.0–46.6)
Hemoglobin: 14.1 g/dL (ref 11.1–15.9)
Immature Grans (Abs): 0 10*3/uL (ref 0.0–0.1)
Immature Granulocytes: 0 %
Lymphocytes Absolute: 2.3 10*3/uL (ref 0.7–3.1)
Lymphs: 33 %
MCH: 32.6 pg (ref 26.6–33.0)
MCHC: 34.3 g/dL (ref 31.5–35.7)
MCV: 95 fL (ref 79–97)
Monocytes Absolute: 0.6 10*3/uL (ref 0.1–0.9)
Monocytes: 9 %
Neutrophils Absolute: 3.8 10*3/uL (ref 1.4–7.0)
Neutrophils: 55 %
Platelets: 162 10*3/uL (ref 150–450)
RBC: 4.32 x10E6/uL (ref 3.77–5.28)
RDW: 12.9 % (ref 11.7–15.4)
WBC: 7 10*3/uL (ref 3.4–10.8)

## 2024-03-10 LAB — CMP14+EGFR
ALT: 57 IU/L — ABNORMAL HIGH (ref 0–32)
AST: 60 IU/L — ABNORMAL HIGH (ref 0–40)
Albumin: 4.2 g/dL (ref 3.8–4.8)
Alkaline Phosphatase: 92 IU/L (ref 44–121)
BUN/Creatinine Ratio: 13 (ref 12–28)
BUN: 11 mg/dL (ref 8–27)
Bilirubin Total: 0.6 mg/dL (ref 0.0–1.2)
CO2: 25 mmol/L (ref 20–29)
Calcium: 10.2 mg/dL (ref 8.7–10.3)
Chloride: 101 mmol/L (ref 96–106)
Creatinine, Ser: 0.84 mg/dL (ref 0.57–1.00)
Globulin, Total: 3.4 g/dL (ref 1.5–4.5)
Glucose: 79 mg/dL (ref 70–99)
Potassium: 4.9 mmol/L (ref 3.5–5.2)
Sodium: 140 mmol/L (ref 134–144)
Total Protein: 7.6 g/dL (ref 6.0–8.5)
eGFR: 72 mL/min/{1.73_m2} (ref >59)

## 2024-03-10 LAB — LIPID PANEL
Cholesterol, Total: 221 mg/dL — ABNORMAL HIGH (ref 100–199)
HDL: 82 mg/dL (ref >39)
LDL CALC COMMENT:: 2.7 ratio (ref 0.0–4.4)
LDL Chol Calc (NIH): 117 mg/dL — ABNORMAL HIGH (ref 0–99)
Triglycerides: 128 mg/dL (ref 0–149)
VLDL Cholesterol Cal: 22 mg/dL (ref 5–40)

## 2024-09-12 ENCOUNTER — Ambulatory Visit (INDEPENDENT_AMBULATORY_CARE_PROVIDER_SITE_OTHER): Payer: Medicare Other | Admitting: Family Medicine

## 2024-09-12 ENCOUNTER — Encounter: Payer: Self-pay | Admitting: Family Medicine

## 2024-09-12 VITALS — BP 128/85 | HR 94 | Ht 64.0 in | Wt 197.0 lb

## 2024-09-12 DIAGNOSIS — R7989 Other specified abnormal findings of blood chemistry: Secondary | ICD-10-CM | POA: Diagnosis not present

## 2024-09-12 DIAGNOSIS — E782 Mixed hyperlipidemia: Secondary | ICD-10-CM | POA: Diagnosis not present

## 2024-09-12 DIAGNOSIS — I10 Essential (primary) hypertension: Secondary | ICD-10-CM

## 2024-09-12 DIAGNOSIS — Z0001 Encounter for general adult medical examination with abnormal findings: Secondary | ICD-10-CM | POA: Diagnosis not present

## 2024-09-12 DIAGNOSIS — Z0000 Encounter for general adult medical examination without abnormal findings: Secondary | ICD-10-CM

## 2024-09-12 MED ORDER — LOSARTAN POTASSIUM-HCTZ 50-12.5 MG PO TABS: 1.0000 | ORAL_TABLET | Freq: Every day | ORAL | 3 refills | Status: DC

## 2024-09-12 NOTE — Progress Notes (Addendum)
 BP 128/85   Pulse 94   Ht 5' 4 (1.626 m)   Wt 197 lb (89.4 kg)   SpO2 95%   BMI 33.81 kg/m    Subjective:   Patient ID: Beth Bryant, female    DOB: 08/28/1946, 78 y.o.   MRN: [id number]  HPI: Beth Bryant is a 78 y.o. female presenting on 09/12/2024 for Medical Management of physical DM chronic Issues, Hyperlipidemia, and Hypertension   Discussed the use of AI scribe software for clinical note transcription with the patient, who gave verbal consent to proceed.  History of Present Illness   Beth Bryant is a 78 year old female who presents for a recheck of her physical exam and blood pressure and routine blood work.  Hypertension management - Takes losartan  and hydrochlorothiazide  for blood pressure control - No stomach pain or lower extremity swelling - Presents for blood pressure recheck  Sleep disturbance - History of sleep disruption related to bronchitis in April - Previously prescribed trazodone  for sleep, but has not used it recently - Prefers to discontinue trazodone  and no longer requires it  Supplement use - Takes supplements for liver function and bone health, but cannot recall specific names - Believes liver health supplements are beneficial          Relevant past medical, surgical, family and social history reviewed and updated as indicated. Interim medical history since our last visit reviewed. Allergies and medications reviewed and updated.  Review of Systems  Constitutional:  Negative for chills and fever.  HENT:  Negative for congestion, ear discharge and ear pain.   Eyes:  Negative for redness and visual disturbance.  Respiratory:  Negative for chest tightness and shortness of breath.   Cardiovascular:  Negative for chest pain and leg swelling.  Genitourinary:  Negative for difficulty urinating and dysuria.  Musculoskeletal:  Negative for back pain and gait problem.  Skin:  Negative for rash.  Neurological:  Negative for  light-headedness and headaches.  Psychiatric/Behavioral:  Negative for agitation and behavioral problems.   All other systems reviewed and are negative.   Per HPI unless specifically indicated above   Allergies as of 09/12/2024   No Known Allergies      Medication List        Accurate as of September 12, 2024  3:13 PM. If you have any questions, ask your nurse or doctor.          STOP taking these medications    traZODone  50 MG tablet Commonly known as: DESYREL  Stopped by: Fonda LABOR Mikita Lesmeister       TAKE these medications    Collagen 1500/C 500-50-0.8 MG Caps Generic drug: Collagen-Vitamin C-Biotin Take by mouth daily.   D3-1000 PO Take by mouth daily.   ENZYME DIGEST PO Take by mouth daily.   Fish Oil 1000 MG Caps Take by mouth.   K2-45 45 MCG Caps Generic drug: Menaquinone-7 Take by mouth daily.   losartan -hydrochlorothiazide  50-12.5 MG tablet Commonly known as: HYZAAR Take 1 tablet by mouth daily.   multivitamin capsule Take 1 capsule by mouth daily.   PROBIOTIC-10 PO Take by mouth daily.   vitamin C 100 MG tablet Take 100 mg by mouth daily.         Objective:   BP 128/85   Pulse 94   Ht 5' 4 (1.626 m)   Wt 197 lb (89.4 kg)   SpO2 95%   BMI 33.81 kg/m   Wt Readings from Last 3 Encounters:  09/12/24 197  lb (89.4 kg)  03/09/24 192 lb (87.1 kg)  02/15/24 191 lb (86.6 kg)    Physical Exam Physical Exam   VITALS: BP- 128/85 CHEST: Lungs clear to auscultation. CARDIOVASCULAR: Regular heart sounds. ABDOMEN: Abdomen non-tender. EXTREMITIES: No swelling in extremities.         Assessment & Plan:   Problem List Items Addressed This Visit       Cardiovascular and Mediastinum   Essential hypertension   Relevant Medications   losartan -hydrochlorothiazide  (HYZAAR) 50-12.5 MG tablet   Other Relevant Orders   CBC with Differential/Platelet   CMP14+EGFR   Lipid panel     Other   Hyperlipidemia   Relevant Medications    losartan -hydrochlorothiazide  (HYZAAR) 50-12.5 MG tablet   Other Relevant Orders   CBC with Differential/Platelet   CMP14+EGFR   Lipid panel   Elevated liver function tests   Relevant Medications   losartan -hydrochlorothiazide  (HYZAAR) 50-12.5 MG tablet   Other Relevant Orders   CBC with Differential/Platelet   CMP14+EGFR   Lipid panel   Other Visit Diagnoses       Physical exam    -  Primary          Essential hypertension Blood pressure controlled at 128/85 mmHg. - Continue losartan  and hydrochlorothiazide .  Mixed hyperlipidemia Monitoring cholesterol levels. - Ordered blood work to recheck cholesterol levels.  General Health Maintenance Discussed routine health maintenance. - Encouraged regular hand washing. - Reminded to keep up with mammogram and bone density scan.          Follow up plan: Return in about 6 months (around 03/12/2025), or if symptoms worsen or fail to improve, for Physical exam.  Counseling provided for all of the vaccine components Orders Placed This Encounter  Procedures   CBC with Differential/Platelet   CMP14+EGFR   Lipid panel    Fonda Levins, MD [facility] 09/12/2024, 3:13 PM

## 2024-09-12 NOTE — Addendum Note (Signed)
 Addended by: MARYANNE CHEW on: 09/12/2024 03:13 PM   Modules accepted: Level of Service

## 2024-09-13 LAB — CBC WITH DIFFERENTIAL/PLATELET
Basophils Absolute: 0 x10E3/uL (ref 0.0–0.2)
Basos: 0 %
EOS (ABSOLUTE): 0.1 x10E3/uL (ref 0.0–0.4)
Eos: 2 %
Hematocrit: 41.4 % (ref 34.0–46.6)
Hemoglobin: 14.1 g/dL (ref 11.1–15.9)
Immature Grans (Abs): 0 x10E3/uL (ref 0.0–0.1)
Immature Granulocytes: 0 %
Lymphocytes Absolute: 1.8 x10E3/uL (ref 0.7–3.1)
Lymphs: 31 %
MCH: 33.4 pg — ABNORMAL HIGH (ref 26.6–33.0)
MCHC: 34.1 g/dL (ref 31.5–35.7)
MCV: 98 fL — ABNORMAL HIGH (ref 79–97)
Monocytes Absolute: 0.5 x10E3/uL (ref 0.1–0.9)
Monocytes: 9 %
Neutrophils Absolute: 3.3 x10E3/uL (ref 1.4–7.0)
Neutrophils: 58 %
Platelets: 166 x10E3/uL (ref 150–450)
RBC: 4.22 x10E6/uL (ref 3.77–5.28)
RDW: 12.4 % (ref 11.7–15.4)
WBC: 5.8 x10E3/uL (ref 3.4–10.8)

## 2024-09-13 LAB — LIPID PANEL
Chol/HDL Ratio: 2.4 ratio (ref 0.0–4.4)
Cholesterol, Total: 217 mg/dL — ABNORMAL HIGH (ref 100–199)
HDL: 91 mg/dL (ref >39)
LDL Chol Calc (NIH): 107 mg/dL — ABNORMAL HIGH (ref 0–99)
Triglycerides: 110 mg/dL (ref 0–149)
VLDL Cholesterol Cal: 19 mg/dL (ref 5–40)

## 2024-09-13 LAB — CMP14+EGFR
ALT: 59 IU/L — ABNORMAL HIGH (ref 0–32)
AST: 62 IU/L — ABNORMAL HIGH (ref 0–40)
Albumin: 4.5 g/dL (ref 3.8–4.8)
Alkaline Phosphatase: 78 IU/L (ref 49–135)
BUN/Creatinine Ratio: 24 (ref 12–28)
BUN: 18 mg/dL (ref 8–27)
Bilirubin Total: 0.6 mg/dL (ref 0.0–1.2)
CO2: 24 mmol/L (ref 20–29)
Calcium: 10.5 mg/dL — ABNORMAL HIGH (ref 8.7–10.3)
Chloride: 98 mmol/L (ref 96–106)
Creatinine, Ser: 0.76 mg/dL (ref 0.57–1.00)
Globulin, Total: 3.1 g/dL (ref 1.5–4.5)
Glucose: 97 mg/dL (ref 70–99)
Potassium: 5.2 mmol/L (ref 3.5–5.2)
Sodium: 137 mmol/L (ref 134–144)
Total Protein: 7.6 g/dL (ref 6.0–8.5)
eGFR: 81 mL/min/1.73 (ref >59)

## 2024-09-16 ENCOUNTER — Ambulatory Visit: Payer: Self-pay | Admitting: Family Medicine

## 2024-09-16 DIAGNOSIS — R7989 Other specified abnormal findings of blood chemistry: Secondary | ICD-10-CM

## 2024-09-27 ENCOUNTER — Other Ambulatory Visit (HOSPITAL_COMMUNITY)

## 2024-10-18 ENCOUNTER — Other Ambulatory Visit: Payer: Self-pay | Admitting: Family Medicine

## 2024-10-18 DIAGNOSIS — I10 Essential (primary) hypertension: Secondary | ICD-10-CM

## 2024-10-18 DIAGNOSIS — R7989 Other specified abnormal findings of blood chemistry: Secondary | ICD-10-CM

## 2024-10-18 DIAGNOSIS — E782 Mixed hyperlipidemia: Secondary | ICD-10-CM

## 2025-03-13 ENCOUNTER — Ambulatory Visit: Admitting: Family Medicine

## 2025-09-14 ENCOUNTER — Encounter: Admitting: Family Medicine
